# Patient Record
Sex: Female | Born: 1990 | Race: Black or African American | Hispanic: No | Marital: Single | Smoking: Never smoker
Health system: Southern US, Community
[De-identification: ages and names within clinical notes are randomized; demographics above are authoritative.]

## PROBLEM LIST (undated history)

## (undated) DIAGNOSIS — D649 Anemia, unspecified: Secondary | ICD-10-CM

## (undated) DIAGNOSIS — T7840XA Allergy, unspecified, initial encounter: Secondary | ICD-10-CM

## (undated) DIAGNOSIS — L309 Dermatitis, unspecified: Secondary | ICD-10-CM

## (undated) DIAGNOSIS — R160 Hepatomegaly, not elsewhere classified: Secondary | ICD-10-CM

## (undated) DIAGNOSIS — I10 Essential (primary) hypertension: Secondary | ICD-10-CM

## (undated) DIAGNOSIS — K661 Hemoperitoneum: Secondary | ICD-10-CM

## (undated) HISTORY — DX: Allergy, unspecified, initial encounter: T78.40XA

## (undated) HISTORY — PX: EYE SURGERY: SHX253

---

## 2012-04-12 ENCOUNTER — Emergency Department (HOSPITAL_COMMUNITY)
Admission: EM | Admit: 2012-04-12 | Discharge: 2012-04-12 | Disposition: A | Discharge status: Home / Self Care | Payer: BC Managed Care – PPO | Source: Home / Self Care | Attending: Emergency Medicine | Admitting: Emergency Medicine

## 2012-04-12 ENCOUNTER — Encounter (HOSPITAL_COMMUNITY): Payer: Self-pay | Admitting: Emergency Medicine

## 2012-04-12 DIAGNOSIS — L309 Dermatitis, unspecified: Secondary | ICD-10-CM

## 2012-04-12 DIAGNOSIS — L259 Unspecified contact dermatitis, unspecified cause: Secondary | ICD-10-CM

## 2012-04-12 HISTORY — DX: Dermatitis, unspecified: L30.9

## 2012-04-12 MED ORDER — HYDROXYZINE HCL 25 MG PO TABS: 25.0000 mg | ORAL_TABLET | Freq: Four times a day (QID) | ORAL | Status: AC

## 2012-04-12 MED ORDER — PREDNISONE 5 MG PO KIT: 1.0000 | PACK | Freq: Every day | ORAL | Status: DC

## 2012-04-12 MED ORDER — TRIAMCINOLONE ACETONIDE 0.1 % EX CREA: TOPICAL_CREAM | Freq: Three times a day (TID) | CUTANEOUS | Status: AC

## 2012-04-12 NOTE — Discharge Instructions (Signed)

## 2012-04-12 NOTE — ED Provider Notes (Signed)
Chief Complaint  Patient presents with  . 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 She has used triamcinolone cream with good results for this in the past. She's had a flareup of eczema for the past month. This began on her right leg and spread to her left arm, back, chest, and abdomen. Its itchy at times but sometimes not. She denies any other obvious exposures.  Review of Systems:  Other than noted above, the patient denies any of the following symptoms: Systemic:  No fever, chills, sweats, weight loss, or fatigue. ENT:  No nasal congestion, rhinorrhea, sore throat, swelling of lips, tongue or throat. Resp:  No cough, wheezing, or shortness of breath. Skin:  No rash, itching, nodules, or suspicious lesions.  PMFSH:  Past medical history, family history, social history, meds, and allergies were reviewed.  Physical Exam:   Vital signs:  BP 136/82  Pulse 90  Temp(Src) 97.7 F (36.5 C) (Oral)  Resp 14  SpO2 97% Gen:  Alert, oriented, in no distress. Skin:  She has a typical eczema rash on her neck, chest, both antecubital fossa as, arms, lower back, abdomen, and right calf. This is characterized by excoriations, hyperpigmentation, lichenification, and exaggerated skin fold lines. Her skin is otherwise clear.  Assessment:  The encounter diagnosis was Eczema.  Plan:   1.  The following meds were prescribed:   New Prescriptions   HYDROXYZINE (ATARAX/VISTARIL) 25 MG TABLET    Take 1 tablet (25 mg total) by mouth every 6 (six) hours.   PREDNISONE 5 MG KIT    Take 1 kit (5 mg total) by mouth daily after breakfast. Prednisone 5 mg 6 day dosepack.  Take as directed.   TRIAMCINOLONE CREAM (KENALOG) 0.1 %    Apply topically 3 (three) times daily.   2.  The patient was instructed in symptomatic care and handouts were given. 3.  The patient was told to return if becoming worse in any way, if no better in 3 or 4  days, and given some red flag symptoms that would indicate earlier return.     Reuben Likes, MD 04/12/12 [phone]

## 2012-04-12 NOTE — ED Notes (Signed)
Pt. Stated, I've had this rash and I have eczema but it just flares up periodically

## 2012-09-09 ENCOUNTER — Encounter (HOSPITAL_COMMUNITY): Payer: Self-pay | Admitting: Emergency Medicine

## 2012-09-09 ENCOUNTER — Emergency Department (HOSPITAL_COMMUNITY)
Admission: EM | Admit: 2012-09-09 | Discharge: 2012-09-09 | Disposition: A | Discharge status: Home / Self Care | Payer: BC Managed Care – PPO | Source: Home / Self Care | Attending: Emergency Medicine | Admitting: Emergency Medicine

## 2012-09-09 DIAGNOSIS — J020 Streptococcal pharyngitis: Secondary | ICD-10-CM

## 2012-09-09 MED ORDER — PENICILLIN G BENZATHINE 1200000 UNIT/2ML IM SUSP
1.2000 10*6.[IU] | Freq: Once | INTRAMUSCULAR | Status: AC
  Administered 2012-09-09: 1.2 10*6.[IU] via INTRAMUSCULAR

## 2012-09-09 MED ORDER — PENICILLIN V POTASSIUM 500 MG PO TABS: 500.0000 mg | ORAL_TABLET | Freq: Four times a day (QID) | ORAL | Status: DC

## 2012-09-09 MED ORDER — PENICILLIN G BENZATHINE 1200000 UNIT/2ML IM SUSP
INTRAMUSCULAR | Status: AC
  Filled 2012-09-09: qty 2

## 2012-09-09 NOTE — ED Provider Notes (Signed)
Chief Complaint  Patient presents with  . Sore Throat    History of Present Illness:   The patient is a 21 year old female with a five-day history of sore throat, pain on swallowing, nasal congestion, swollen, tender glands in her neck, and a dry cough. She denies any headache, GI symptoms, or previous history of strep. No known exposure to strep or to mono.  Review of Systems:  Other than as noted above, the patient denies any of the following symptoms. Systemic:  No fever, chills, sweats, fatigue, myalgias, headache, or anorexia. Eye:  No redness, pain or drainage. ENT:  No earache, ear congestion, nasal congestion, sneezing, rhinorrhea, sinus pressure, sinus pain, or post nasal drip. Lungs:  No cough, sputum production, wheezing, shortness of breath, or chest pain. GI:  No abdominal pain, nausea, vomiting, or diarrhea. Skin:  No rash or itching.  PMFSH:  Past medical history, family history, social history, meds, allergies, and nurse's notes were reviewed.  There is no known exposure to strep or mono.  No prior history of step or mono.  The patient denies use of tobacco.  Physical Exam:   Vital signs:  BP 139/96  Pulse 89  Temp 98.3 F (36.8 C) (Oral)  Resp 18  SpO2 100%  LMP 08/28/2012 General:  Alert, in no distress. Eye:  No conjunctival injection or drainage. Lids were normal. ENT:  TMs and canals were normal, without erythema or inflammation.  Nasal mucosa was clear and uncongested, without drainage.  Mucous membranes were moist.  Exam of pharynx was difficult because of poor patient cooperation, no definite inflammation, swelling, or exudate seen.  There were no oral ulcerations or lesions. Neck:  Supple, no adenopathy, tenderness or mass. Lungs:  No respiratory distress.  Lungs were clear to auscultation, without wheezes, rales or rhonchi.  Breath sounds were clear and equal bilaterally.  Heart:  Regular rhythm, without gallops, murmers or rubs. Skin:  Clear, warm, and dry,  without rash or lesions.  Labs:   Results for orders placed during the hospital encounter of 09/09/12  POCT RAPID STREP A ([facility] ONLY)      Component Value Range   Streptococcus, Group A Screen (Direct) POSITIVE (*) NEGATIVE   Course in Urgent Care Center:   She was given LA Bicillin 1.2 million units IM and tolerated this well without any immediate side effects.  Assessment:  The encounter diagnosis was Strep throat.  Plan:   1.  The following meds were prescribed:   New Prescriptions   No medications on file   2.  The patient was instructed in symptomatic care including hot saline gargles, throat lozenges, infectious precautions, and need to trade out toothbrush. Handouts were given. 3.  The patient was told to return if becoming worse in any way, if no better in 3 or 4 days, and given some red flag symptoms that would indicate earlier return.    Reuben Likes, MD 09/09/12 [phone]

## 2012-09-09 NOTE — ED Notes (Signed)
Pt states she has had a sore throat for 5 days now that has not gotten better despite OTC meds and "home remedies". Pt c/o productive cough of green thick discharge, no other pain. Pt said she has been congested, denies ear and head pain.

## 2014-01-30 ENCOUNTER — Ambulatory Visit (INDEPENDENT_AMBULATORY_CARE_PROVIDER_SITE_OTHER): Payer: BC Managed Care – PPO | Admitting: Internal Medicine

## 2014-01-30 VITALS — BP 124/76 | HR 93 | Temp 98.0°F | Resp 17 | Ht 63.0 in | Wt 218.0 lb

## 2014-01-30 DIAGNOSIS — R05 Cough: Secondary | ICD-10-CM

## 2014-01-30 DIAGNOSIS — R059 Cough, unspecified: Secondary | ICD-10-CM

## 2014-01-30 DIAGNOSIS — J069 Acute upper respiratory infection, unspecified: Secondary | ICD-10-CM

## 2014-01-30 MED ORDER — HYDROCODONE-HOMATROPINE 5-1.5 MG/5ML PO SYRP: 5.0000 mL | ORAL_SOLUTION | Freq: Four times a day (QID) | ORAL | Status: DC | PRN

## 2014-01-30 NOTE — Progress Notes (Signed)
   Subjective:    Patient ID: Latoya Kramer, female    DOB: 1991-05-21, 23 y.o.   MRN: [id number] This chart was scribed for Latoya Lin, MD by Rolanda Lundborg, ED Scribe. This patient was seen in room 10 and the patient's care was started at 6:44 PM.  Chief Complaint  Patient presents with  . Chest Pain  . Cough  . Sinusitis  . URI    HPI HPI Comments: Latoya Kramer is a 23 y.o. female who presents to the [facility] complaining of constant cough since walking outside in the rain yesterday with associated nasal congestion and rhinorrhea. She reports coughing up mucous but no vomiting. She took NyQuil last night. She had hot tea with honey this morning.   Past Medical History  Diagnosis Date  . Eczema   . Allergy    No current outpatient prescriptions on file prior to visit.   No current facility-administered medications on file prior to visit.   Allergies  Allergen Reactions  . Sulfa Antibiotics     Review of Systems  Constitutional: Negative for fever and appetite change.  Gastrointestinal: Negative for vomiting.       Objective:   Physical Exam  Nursing note and vitals reviewed. Constitutional: She is oriented to person, place, and time. She appears well-developed and well-nourished. No distress.  HENT:  Head: Normocephalic and atraumatic.  Nose: Nose normal.  Mouth/Throat: Oropharynx is clear and moist. No oropharyngeal exudate.  Eyes: Conjunctivae and EOM are normal. Pupils are equal, round, and reactive to light.  Neck: Normal range of motion. Neck supple.  Cardiovascular: Normal rate and regular rhythm.   Pulmonary/Chest: Effort normal and breath sounds normal. No respiratory distress.  Musculoskeletal: Normal range of motion.  Neurological: She is alert and oriented to person, place, and time.  Skin: Skin is warm and dry.  Psychiatric: She has a normal mood and affect. Her behavior is normal.    Filed Vitals:   01/30/14 1819  BP: 124/76  Pulse: 93  Temp: 98 F (36.7 C)  TempSrc: Oral  Resp: 17  Height: 5\' 3"  (1.6 m)  Weight: 218 lb (98.884 kg)  SpO2: 100%         Assessment & Plan:  Acute upper respiratory infections of unspecified site  Cough  Meds ordered this encounter  Medications  . HYDROcodone-homatropine (HYCODAN) 5-1.5 MG/5ML syrup    Sig: Take 5 mLs by mouth every 6 (six) hours as needed for cough.    Dispense:  120 mL    Refill:  0   Continue OTC meds  I have completed the patient encounter in its entirety as documented by the scribe, with editing by me where necessary. Ahri Olson P. Laney Pastor, M.D.

## 2015-07-06 ENCOUNTER — Encounter (HOSPITAL_COMMUNITY): Payer: Self-pay | Admitting: Emergency Medicine

## 2015-07-06 ENCOUNTER — Emergency Department (HOSPITAL_COMMUNITY)
Admission: EM | Admit: 2015-07-06 | Discharge: 2015-07-06 | Disposition: A | Discharge status: Home / Self Care | Payer: BC Managed Care – PPO | Attending: Emergency Medicine | Admitting: Emergency Medicine

## 2015-07-06 DIAGNOSIS — S199XXA Unspecified injury of neck, initial encounter: Secondary | ICD-10-CM | POA: Insufficient documentation

## 2015-07-06 DIAGNOSIS — Y999 Unspecified external cause status: Secondary | ICD-10-CM | POA: Insufficient documentation

## 2015-07-06 DIAGNOSIS — S4990XA Unspecified injury of shoulder and upper arm, unspecified arm, initial encounter: Secondary | ICD-10-CM | POA: Insufficient documentation

## 2015-07-06 DIAGNOSIS — S3992XA Unspecified injury of lower back, initial encounter: Secondary | ICD-10-CM | POA: Diagnosis not present

## 2015-07-06 DIAGNOSIS — Z79899 Other long term (current) drug therapy: Secondary | ICD-10-CM | POA: Diagnosis not present

## 2015-07-06 DIAGNOSIS — Y9389 Activity, other specified: Secondary | ICD-10-CM | POA: Diagnosis not present

## 2015-07-06 DIAGNOSIS — Y92410 Unspecified street and highway as the place of occurrence of the external cause: Secondary | ICD-10-CM | POA: Insufficient documentation

## 2015-07-06 DIAGNOSIS — Z872 Personal history of diseases of the skin and subcutaneous tissue: Secondary | ICD-10-CM | POA: Insufficient documentation

## 2015-07-06 LAB — I-STAT BETA HCG BLOOD, ED (MC, WL, AP ONLY): I-stat hCG, quantitative: <5 m[IU]/mL (ref <5)

## 2015-07-06 MED ORDER — KETOROLAC TROMETHAMINE 60 MG/2ML IM SOLN
60.0000 mg | Freq: Once | INTRAMUSCULAR | Status: AC
  Administered 2015-07-06: 60 mg via INTRAMUSCULAR
  Filled 2015-07-06: qty 2

## 2015-07-06 MED ORDER — IBUPROFEN 800 MG PO TABS: 800.0000 mg | ORAL_TABLET | Freq: Three times a day (TID) | ORAL | Status: DC | PRN

## 2015-07-06 MED ORDER — HYDROCODONE-ACETAMINOPHEN 5-325 MG PO TABS
1.0000 | ORAL_TABLET | Freq: Once | ORAL | Status: DC
  Filled 2015-07-06: qty 1

## 2015-07-06 NOTE — ED Notes (Signed)
MD at bedside. 
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
## 2018-04-23 ENCOUNTER — Encounter (HOSPITAL_COMMUNITY): Payer: Self-pay

## 2018-04-23 ENCOUNTER — Other Ambulatory Visit: Payer: Self-pay

## 2018-04-23 DIAGNOSIS — R739 Hyperglycemia, unspecified: Secondary | ICD-10-CM | POA: Diagnosis present

## 2018-04-23 DIAGNOSIS — Z79899 Other long term (current) drug therapy: Secondary | ICD-10-CM

## 2018-04-23 DIAGNOSIS — R6510 Systemic inflammatory response syndrome (SIRS) of non-infectious origin without acute organ dysfunction: Secondary | ICD-10-CM | POA: Diagnosis present

## 2018-04-23 DIAGNOSIS — Z6837 Body mass index (BMI) 37.0-37.9, adult: Secondary | ICD-10-CM

## 2018-04-23 DIAGNOSIS — I959 Hypotension, unspecified: Secondary | ICD-10-CM | POA: Diagnosis present

## 2018-04-23 DIAGNOSIS — D134 Benign neoplasm of liver: Secondary | ICD-10-CM | POA: Diagnosis not present

## 2018-04-23 DIAGNOSIS — I10 Essential (primary) hypertension: Secondary | ICD-10-CM | POA: Diagnosis present

## 2018-04-23 DIAGNOSIS — Z531 Procedure and treatment not carried out because of patient's decision for reasons of belief and group pressure: Secondary | ICD-10-CM | POA: Diagnosis present

## 2018-04-23 DIAGNOSIS — J189 Pneumonia, unspecified organism: Secondary | ICD-10-CM | POA: Diagnosis not present

## 2018-04-23 DIAGNOSIS — D62 Acute posthemorrhagic anemia: Secondary | ICD-10-CM | POA: Diagnosis present

## 2018-04-23 DIAGNOSIS — R51 Headache: Secondary | ICD-10-CM | POA: Diagnosis not present

## 2018-04-23 DIAGNOSIS — K661 Hemoperitoneum: Secondary | ICD-10-CM | POA: Diagnosis present

## 2018-04-23 DIAGNOSIS — R188 Other ascites: Secondary | ICD-10-CM | POA: Diagnosis present

## 2018-04-23 DIAGNOSIS — R000 Tachycardia, unspecified: Secondary | ICD-10-CM | POA: Diagnosis present
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
## 2018-04-24 ENCOUNTER — Emergency Department (HOSPITAL_COMMUNITY): Payer: BC Managed Care – PPO

## 2018-04-24 ENCOUNTER — Inpatient Hospital Stay (HOSPITAL_COMMUNITY)
Admission: EM | Admit: 2018-04-24 | Discharge: 2018-05-01 | DRG: 987 | Disposition: A | Discharge status: Other Acute Inpatient Hospital | Payer: BC Managed Care – PPO | Attending: Internal Medicine | Admitting: Internal Medicine

## 2018-04-24 ENCOUNTER — Encounter (HOSPITAL_COMMUNITY): Payer: Self-pay | Admitting: Radiology

## 2018-04-24 ENCOUNTER — Inpatient Hospital Stay (HOSPITAL_COMMUNITY): Payer: BC Managed Care – PPO

## 2018-04-24 DIAGNOSIS — I159 Secondary hypertension, unspecified: Secondary | ICD-10-CM

## 2018-04-24 DIAGNOSIS — R000 Tachycardia, unspecified: Secondary | ICD-10-CM | POA: Diagnosis present

## 2018-04-24 DIAGNOSIS — D72829 Elevated white blood cell count, unspecified: Secondary | ICD-10-CM | POA: Diagnosis present

## 2018-04-24 DIAGNOSIS — R1031 Right lower quadrant pain: Secondary | ICD-10-CM | POA: Diagnosis not present

## 2018-04-24 DIAGNOSIS — R509 Fever, unspecified: Secondary | ICD-10-CM | POA: Diagnosis not present

## 2018-04-24 DIAGNOSIS — J189 Pneumonia, unspecified organism: Secondary | ICD-10-CM | POA: Diagnosis present

## 2018-04-24 DIAGNOSIS — R109 Unspecified abdominal pain: Secondary | ICD-10-CM

## 2018-04-24 DIAGNOSIS — R739 Hyperglycemia, unspecified: Secondary | ICD-10-CM | POA: Diagnosis present

## 2018-04-24 DIAGNOSIS — Z79899 Other long term (current) drug therapy: Secondary | ICD-10-CM | POA: Diagnosis not present

## 2018-04-24 DIAGNOSIS — R6510 Systemic inflammatory response syndrome (SIRS) of non-infectious origin without acute organ dysfunction: Secondary | ICD-10-CM | POA: Diagnosis present

## 2018-04-24 DIAGNOSIS — I10 Essential (primary) hypertension: Secondary | ICD-10-CM | POA: Diagnosis present

## 2018-04-24 DIAGNOSIS — E669 Obesity, unspecified: Secondary | ICD-10-CM | POA: Diagnosis present

## 2018-04-24 DIAGNOSIS — K661 Hemoperitoneum: Secondary | ICD-10-CM | POA: Diagnosis present

## 2018-04-24 DIAGNOSIS — D649 Anemia, unspecified: Secondary | ICD-10-CM | POA: Diagnosis not present

## 2018-04-24 DIAGNOSIS — D134 Benign neoplasm of liver: Secondary | ICD-10-CM | POA: Diagnosis present

## 2018-04-24 DIAGNOSIS — D62 Acute posthemorrhagic anemia: Secondary | ICD-10-CM | POA: Diagnosis present

## 2018-04-24 DIAGNOSIS — Z6837 Body mass index (BMI) 37.0-37.9, adult: Secondary | ICD-10-CM | POA: Diagnosis not present

## 2018-04-24 DIAGNOSIS — I959 Hypotension, unspecified: Secondary | ICD-10-CM | POA: Diagnosis present

## 2018-04-24 DIAGNOSIS — R160 Hepatomegaly, not elsewhere classified: Secondary | ICD-10-CM

## 2018-04-24 DIAGNOSIS — R188 Other ascites: Secondary | ICD-10-CM | POA: Diagnosis present

## 2018-04-24 DIAGNOSIS — R51 Headache: Secondary | ICD-10-CM | POA: Diagnosis not present

## 2018-04-24 DIAGNOSIS — Z531 Procedure and treatment not carried out because of patient's decision for reasons of belief and group pressure: Secondary | ICD-10-CM | POA: Diagnosis present

## 2018-04-24 HISTORY — DX: Hepatomegaly, not elsewhere classified: R16.0

## 2018-04-24 HISTORY — DX: Hemoperitoneum: K66.1

## 2018-04-24 HISTORY — DX: Anemia, unspecified: D64.9

## 2018-04-24 LAB — CBC WITH DIFFERENTIAL/PLATELET
Basophils Absolute: 0 10*3/uL (ref 0.0–0.1)
Basophils Relative: 0 %
EOS ABS: 0 10*3/uL (ref 0.0–0.7)
EOS PCT: 0 %
HCT: 29.3 % — ABNORMAL LOW (ref 36.0–46.0)
Hemoglobin: 9.4 g/dL — ABNORMAL LOW (ref 12.0–15.0)
LYMPHS ABS: 1.8 10*3/uL (ref 0.7–4.0)
LYMPHS PCT: 16 %
MCH: 29.1 pg (ref 26.0–34.0)
MCHC: 32.1 g/dL (ref 30.0–36.0)
MCV: 90.7 fL (ref 78.0–100.0)
MONO ABS: 0.5 10*3/uL (ref 0.1–1.0)
MONOS PCT: 5 %
Neutro Abs: 9 10*3/uL — ABNORMAL HIGH (ref 1.7–7.7)
Neutrophils Relative %: 79 %
PLATELETS: 219 10*3/uL (ref 150–400)
RBC: 3.23 MIL/uL — ABNORMAL LOW (ref 3.87–5.11)
RDW: 13.8 % (ref 11.5–15.5)
WBC: 11.3 10*3/uL — ABNORMAL HIGH (ref 4.0–10.5)

## 2018-04-24 LAB — CBC
HCT: 33.5 % — ABNORMAL LOW (ref 36.0–46.0)
HEMATOCRIT: 27.6 % — AB (ref 36.0–46.0)
HEMOGLOBIN: 10.6 g/dL — AB (ref 12.0–15.0)
HEMOGLOBIN: 8.8 g/dL — AB (ref 12.0–15.0)
MCH: 29 pg (ref 26.0–34.0)
MCH: 29.1 pg (ref 26.0–34.0)
MCHC: 31.6 g/dL (ref 30.0–36.0)
MCHC: 31.9 g/dL (ref 30.0–36.0)
MCV: 91.5 fL (ref 78.0–100.0)
MCV: 91.4 fL (ref 78.0–100.0)
Platelets: 272 10*3/uL (ref 150–400)
Platelets: 198 10*3/uL (ref 150–400)
RBC: 3.66 MIL/uL — AB (ref 3.87–5.11)
RBC: 3.02 MIL/uL — AB (ref 3.87–5.11)
RDW: 13.8 % (ref 11.5–15.5)
RDW: 13.7 % (ref 11.5–15.5)
WBC: 16.8 10*3/uL — ABNORMAL HIGH (ref 4.0–10.5)
WBC: 10.3 10*3/uL (ref 4.0–10.5)

## 2018-04-24 LAB — URINALYSIS, ROUTINE W REFLEX MICROSCOPIC
Bilirubin Urine: NEGATIVE
GLUCOSE, UA: NEGATIVE mg/dL
Ketones, ur: 5 mg/dL — AB
NITRITE: NEGATIVE
PH: 5 (ref 5.0–8.0)
PROTEIN: 100 mg/dL — AB
SPECIFIC GRAVITY, URINE: 1.027 (ref 1.005–1.030)

## 2018-04-24 LAB — PROTIME-INR
INR: 1.07
Prothrombin Time: 13.8 seconds (ref 11.4–15.2)

## 2018-04-24 LAB — COMPREHENSIVE METABOLIC PANEL
ALBUMIN: 4.4 g/dL (ref 3.5–5.0)
ALT: 65 U/L — ABNORMAL HIGH (ref 14–54)
ANION GAP: 11 (ref 5–15)
AST: 67 U/L — ABNORMAL HIGH (ref 15–41)
Alkaline Phosphatase: 56 U/L (ref 38–126)
BUN: 15 mg/dL (ref 6–20)
CALCIUM: 9.5 mg/dL (ref 8.9–10.3)
CO2: 24 mmol/L (ref 22–32)
Chloride: 107 mmol/L (ref 101–111)
Creatinine, Ser: 0.81 mg/dL (ref 0.44–1.00)
GFR calc non Af Amer: >60 mL/min (ref >60)
GLUCOSE: 154 mg/dL — AB (ref 65–99)
Potassium: 4.1 mmol/L (ref 3.5–5.1)
SODIUM: 142 mmol/L (ref 135–145)
Total Bilirubin: 0.5 mg/dL (ref 0.3–1.2)
Total Protein: 8 g/dL (ref 6.5–8.1)

## 2018-04-24 LAB — APTT: aPTT: 24 seconds (ref 24–36)

## 2018-04-24 LAB — HEMOGLOBIN A1C
Hgb A1c MFr Bld: 5.8 % — ABNORMAL HIGH (ref 4.8–5.6)
Mean Plasma Glucose: 119.76 mg/dL

## 2018-04-24 LAB — CBG MONITORING, ED: GLUCOSE-CAPILLARY: 113 mg/dL — AB (ref 65–99)

## 2018-04-24 LAB — GLUCOSE, CAPILLARY
Glucose-Capillary: 84 mg/dL (ref 65–99)
Glucose-Capillary: 106 mg/dL — ABNORMAL HIGH (ref 65–99)

## 2018-04-24 LAB — I-STAT BETA HCG BLOOD, ED (MC, WL, AP ONLY): I-stat hCG, quantitative: <5 m[IU]/mL (ref <5)

## 2018-04-24 LAB — LACTIC ACID, PLASMA: Lactic Acid, Venous: 1.7 mmol/L (ref 0.5–1.9)

## 2018-04-24 LAB — LIPASE, BLOOD: LIPASE: 35 U/L (ref 11–51)

## 2018-04-24 MED ORDER — LORAZEPAM 2 MG/ML IJ SOLN: 1.0000 mg | Freq: Once | INTRAMUSCULAR | Status: DC

## 2018-04-24 MED ORDER — ONDANSETRON HCL 4 MG/2ML IJ SOLN: 4.0000 mg | Freq: Four times a day (QID) | INTRAMUSCULAR | Status: DC | PRN

## 2018-04-24 MED ORDER — INSULIN ASPART 100 UNIT/ML ~~LOC~~ SOLN: 0.0000 [IU] | Freq: Every day | SUBCUTANEOUS | Status: DC

## 2018-04-24 MED ORDER — FAMOTIDINE IN NACL 20-0.9 MG/50ML-% IV SOLN
20.0000 mg | Freq: Once | INTRAVENOUS | Status: AC
  Administered 2018-04-24: 20 mg via INTRAVENOUS
  Filled 2018-04-24: qty 50

## 2018-04-24 MED ORDER — HYDROMORPHONE HCL 1 MG/ML IJ SOLN
1.0000 mg | INTRAMUSCULAR | Status: DC | PRN
  Administered 2018-04-24 – 2018-04-29 (×21): 1 mg via INTRAVENOUS
  Filled 2018-04-24 (×21): qty 1

## 2018-04-24 MED ORDER — ONDANSETRON HCL 4 MG/2ML IJ SOLN
4.0000 mg | Freq: Once | INTRAMUSCULAR | Status: AC
  Administered 2018-04-24: 4 mg via INTRAVENOUS
  Filled 2018-04-24: qty 2

## 2018-04-24 MED ORDER — IOPAMIDOL (ISOVUE-300) INJECTION 61%
100.0000 mL | Freq: Once | INTRAVENOUS | Status: AC | PRN
  Administered 2018-04-24: 100 mL via INTRAVENOUS

## 2018-04-24 MED ORDER — IOPAMIDOL (ISOVUE-300) INJECTION 61%
INTRAVENOUS | Status: AC
  Filled 2018-04-24: qty 100

## 2018-04-24 MED ORDER — SODIUM CHLORIDE 0.9 % IV BOLUS
1000.0000 mL | Freq: Once | INTRAVENOUS | Status: AC
  Administered 2018-04-24: 1000 mL via INTRAVENOUS

## 2018-04-24 MED ORDER — LORAZEPAM 2 MG/ML IJ SOLN
1.0000 mg | Freq: Once | INTRAMUSCULAR | Status: AC
  Administered 2018-04-24: 1 mg via INTRAVENOUS
  Filled 2018-04-24: qty 1

## 2018-04-24 MED ORDER — GADOBENATE DIMEGLUMINE 529 MG/ML IV SOLN: 20.0000 mL | Freq: Once | INTRAVENOUS | Status: DC | PRN

## 2018-04-24 MED ORDER — SODIUM CHLORIDE 0.9 % IV SOLN
INTRAVENOUS | Status: DC
  Administered 2018-04-24 (×2): via INTRAVENOUS

## 2018-04-24 MED ORDER — INSULIN ASPART 100 UNIT/ML ~~LOC~~ SOLN
0.0000 [IU] | Freq: Three times a day (TID) | SUBCUTANEOUS | Status: DC
  Administered 2018-04-25 (×2): 1 [IU] via SUBCUTANEOUS

## 2018-04-24 MED ORDER — GADOBENATE DIMEGLUMINE 529 MG/ML IV SOLN
20.0000 mL | Freq: Once | INTRAVENOUS | Status: AC | PRN
  Administered 2018-04-24: 20 mL via INTRAVENOUS

## 2018-04-24 MED ORDER — HYDROMORPHONE HCL 1 MG/ML IJ SOLN
1.0000 mg | Freq: Once | INTRAMUSCULAR | Status: AC
  Administered 2018-04-24: 1 mg via INTRAVENOUS
  Filled 2018-04-24: qty 1

## 2018-04-24 MED ORDER — DICYCLOMINE HCL 10 MG/ML IM SOLN
20.0000 mg | Freq: Once | INTRAMUSCULAR | Status: AC
  Administered 2018-04-24: 20 mg via INTRAMUSCULAR
  Filled 2018-04-24: qty 2

## 2018-04-24 MED ORDER — ONDANSETRON HCL 4 MG PO TABS: 4.0000 mg | ORAL_TABLET | Freq: Four times a day (QID) | ORAL | Status: DC | PRN

## 2018-04-24 MED ORDER — GI COCKTAIL ~~LOC~~
30.0000 mL | Freq: Once | ORAL | Status: AC
  Administered 2018-04-24: 30 mL via ORAL
  Filled 2018-04-24: qty 30

## 2018-04-24 NOTE — ED Notes (Signed)
Urine culture not sent; she only had a small amount of output.

## 2018-04-24 NOTE — ED Provider Notes (Signed)
Pt with abdominal pain, nausea, vomiting, RUQ abd pain. CT pending   8:10 AM Spoke with radiologist, patient with large hypodense mass in the posterior right liver lobe, with moderate volume of posterior right acute hemoperitoneum.  Initial hemoglobin is 10.4.  Patient is Jehov[employer]'s Witness, unable to receive blood products.  She is tachycardic.  Normal blood pressure.  Will call in general surgery. Pt denies any trauma to the abdomen   I spoke with Dr. Harlow Asa, with general surgery who reviewed patient's scan.  He asked to admit to medicine for possible EPO treatment and further monitoring and observation.  He does not think this is a surgical issue at this time, however he will consult on patient.  I spoke with medicine they will admit her.   Vitals:   04/24/18 0232 04/24/18 0502 04/24/18 0700 04/24/18 0907  BP: 123/88 124/81 (!) 123/102 (!) 133/96  Pulse: (!) 112 (!) 101 (!) 108 (!) 118  Resp: 16 18 18 18   Temp:      TempSrc:      SpO2: 100% 98% 100% 100%      Jeannett Senior, PA-C 04/24/18 0258    Margette Fast, MD 04/24/18 [phone]

## 2018-04-24 NOTE — H&P (Signed)
History and Physical   Chief Complaint:  Abdominal pain  HPI: Patient is a 27 y.o female that presents to the ED with complaints of abdominal pain. States that the pain started last night after eating and drinking alcohol at a wedding. She came home and began experiencing pain in her upper abdomen. States that the sharp pain does not radiate anywhere else and is a 10/10 in intensity. States the pain is worse with movement and laying on right side; pain is improved with laying on left side. She did not attempt any therapies prior to coming to the ED. Also endorses nausea, belching, and one episode of emesis. Denies seeing blood in the emesis. Denies flatus. Denies any trauma to the abdomen. No history of any abdominal surgeries.   Allergies:   Allergies  Allergen Reactions  . Sulfa Antibiotics Other (See Comments)    unknown      Past Medical History:  Diagnosis Date  . Allergy   . Anemia   . Eczema   . Hemoperitoneum, nontraumatic   . Liver mass     Past Surgical History:  Procedure Laterality Date  . EYE SURGERY      Prior to Admission medications   Medication Sig Start Date End Date Taking? Authorizing Provider  LO LOESTRIN FE 1 MG-10 MCG / 10 MCG tablet Take 1 tablet by mouth daily. 04/08/18  Yes [provider]  triamterene-hydrochlorothiazide (DYAZIDE) 37.5-25 MG capsule Take 1 capsule by mouth daily. 04/08/18  Yes [provider]    Social History: Denies tobacco and drug use. Admits to occasional alcohol consumption (2 drinks per month).   History reviewed. No pertinent family history.  Review of Systems:  Review of Systems  Constitutional: Negative for chills, fever and weight loss.  Respiratory: Negative for hemoptysis and shortness of breath.   Cardiovascular: Negative for chest pain and palpitations.  Gastrointestinal: Positive for abdominal pain, nausea and vomiting.  Genitourinary: Negative for dysuria, frequency and urgency.     Physical Exam: Blood pressure (!) 133/96, pulse (!) 118, temperature (!) 97.5 F (36.4 C), temperature source Oral, resp. rate 18, last menstrual period 04/24/2018, SpO2 100 %.   Physical Exam  Constitutional: She is oriented to person, place, and time and well-developed, well-nourished, and in no distress.  HENT:  Head: Normocephalic and atraumatic.  Cardiovascular: Normal rate and regular rhythm.  Pulmonary/Chest: Breath sounds normal.  Abdominal: Soft. She exhibits distension. There is tenderness. There is guarding. There is no rebound.  Tenderness diffusely in the upper abdomen with some right sided prominence.   Neurological: She is alert and oriented to person, place, and time.    Labs on Admission:  Results for orders placed or performed during the hospital encounter of 04/24/18 (from the past 48 hour(s))  Lipase, blood     Status: None   Collection Time: 04/24/18  2:31 AM  Result Value Ref Range   Lipase 35 11 - 51 U/L    Comment: Performed at [facility], [place] [address], [place], [place] [address]  Comprehensive metabolic panel     Status: Abnormal   Collection Time: 04/24/18  2:31 AM  Result Value Ref Range   Sodium 142 135 - 145 mmol/L   Potassium 4.1 3.5 - 5.1 mmol/L   Chloride 107 101 - 111 mmol/L   CO2 24 22 - 32 mmol/L   Glucose, Bld 154 (H) 65 - 99 mg/dL   BUN 15 6 - 20 mg/dL   Creatinine, Ser 0.81  0.44 - 1.00 mg/dL   Calcium 9.5 8.9 - 10.3 mg/dL   Total Protein 8.0 6.5 - 8.1 g/dL   Albumin 4.4 3.5 - 5.0 g/dL   AST 67 (H) 15 - 41 U/L   ALT 65 (H) 14 - 54 U/L   Alkaline Phosphatase 56 38 - 126 U/L   Total Bilirubin 0.5 0.3 - 1.2 mg/dL   GFR calc non Af Amer >60 >60 mL/min   GFR calc Af Amer >60 >60 mL/min    Comment: (NOTE) The eGFR has been calculated using the CKD EPI equation. This calculation has not been validated in all clinical situations. eGFR's persistently <60 mL/min signify possible Chronic Kidney Disease.    Anion gap  11 5 - 15    Comment: Performed at [facility], [place] [address] [place], [place] [address]  CBC     Status: Abnormal   Collection Time: 04/24/18  2:31 AM  Result Value Ref Range   WBC 16.8 (H) 4.0 - 10.5 K/uL   RBC 3.66 (L) 3.87 - 5.11 MIL/uL   Hemoglobin 10.6 (L) 12.0 - 15.0 g/dL   HCT 33.5 (L) 36.0 - 46.0 %   MCV 91.5 78.0 - 100.0 fL   MCH 29.0 26.0 - 34.0 pg   MCHC 31.6 30.0 - 36.0 g/dL   RDW 13.8 11.5 - 15.5 %   Platelets 272 150 - 400 K/uL    Comment: Performed at [facility], [place] [address] [place], [place] [address]  Urinalysis, Routine w reflex microscopic     Status: Abnormal   Collection Time: 04/24/18  2:31 AM  Result Value Ref Range   Color, Urine YELLOW YELLOW   APPearance CLOUDY (A) CLEAR   Specific Gravity, Urine 1.027 1.005 - 1.030   pH 5.0 5.0 - 8.0   Glucose, UA NEGATIVE NEGATIVE mg/dL   Hgb urine dipstick LARGE (A) NEGATIVE   Bilirubin Urine NEGATIVE NEGATIVE   Ketones, ur 5 (A) NEGATIVE mg/dL   Protein, ur 100 (A) NEGATIVE mg/dL   Nitrite NEGATIVE NEGATIVE   Leukocytes, UA TRACE (A) NEGATIVE   RBC / HPF 6-10 0 - 5 RBC/hpf   WBC, UA 6-10 0 - 5 WBC/hpf   Bacteria, UA FEW (A) NONE SEEN   Squamous Epithelial / LPF 11-20 0 - 5    Comment: Please note change in reference range.   Mucus PRESENT    Hyaline Casts, UA PRESENT     Comment: Performed at [facility], [place] [address] [place], [place] [address]  I-Stat beta hCG blood, ED     Status: None   Collection Time: 04/24/18  2:43 AM  Result Value Ref Range   I-stat hCG, quantitative <5.0 <5 mIU/mL   Comment 3            Comment:   GEST. AGE      CONC.  (mIU/mL)   <=1 WEEK        5 - 50     2 WEEKS       50 - 500     3 WEEKS       100 - 10,000     4 WEEKS     1,000 - 30,000        FEMALE AND NON-PREGNANT FEMALE:     LESS THAN 5 mIU/mL   Protime-INR     Status: None   Collection Time: 04/24/18  8:56 AM  Result Value Ref Range   Prothrombin Time  13.8 11.4 - 15.2 seconds   INR 1.07     Comment: Performed at [facility], [place] [address], [place], [place] [address]  CBC with Differential     Status: Abnormal   Collection Time: 04/24/18  8:56 AM  Result Value Ref Range   WBC 11.3 (H) 4.0 - 10.5 K/uL   RBC 3.23 (L) 3.87 - 5.11 MIL/uL   Hemoglobin 9.4 (L) 12.0 - 15.0 g/dL   HCT 29.3 (L) 36.0 - 46.0 %   MCV 90.7 78.0 - 100.0 fL   MCH 29.1 26.0 - 34.0 pg   MCHC 32.1 30.0 - 36.0 g/dL   RDW 13.8 11.5 - 15.5 %   Platelets 219 150 - 400 K/uL   Neutrophils Relative % 79 %   Neutro Abs 9.0 (H) 1.7 - 7.7 K/uL   Lymphocytes Relative 16 %   Lymphs Abs 1.8 0.7 - 4.0 K/uL   Monocytes Relative 5 %   Monocytes Absolute 0.5 0.1 - 1.0 K/uL   Eosinophils Relative 0 %   Eosinophils Absolute 0.0 0.0 - 0.7 K/uL   Basophils Relative 0 %   Basophils Absolute 0.0 0.0 - 0.1 K/uL    Comment: Performed at [facility], [place] [address] [place], [place] [address]  APTT     Status: None   Collection Time: 04/24/18  8:56 AM  Result Value Ref Range   aPTT 24 24 - 36 seconds    Comment: Performed at [facility], [place] [address] [place], [place] [address]    Radiological Exams on Admission: US Abdomen Complete  Result Date: 04/24/2018 CLINICAL DATA:  27 year old female with abdominal pain, nausea vomiting. EXAM: ABDOMEN ULTRASOUND COMPLETE COMPARISON:  Abdominal radiograph dated 04/24/2018 FINDINGS: Evaluation is limited due to patient's body habitus. Gallbladder: No gallstones or wall thickening visualized. No sonographic Murphy sign noted by sonographer. Probable trace free fluid adjacent to the gallbladder. Common bile duct: Diameter: 3 mm Liver: No focal lesion identified. Within normal limits in parenchymal echogenicity. Portal vein is patent on color Doppler imaging with normal direction of blood flow towards the liver. IVC: No abnormality visualized. Pancreas: Poorly visualized. Spleen: Size and  appearance within normal limits. Right Kidney: Length: 11.5 cm. The kidneys poorly visualized. No hydronephrosis. Left Kidney: Length: 10.6 cm.  The left kidney is poorly visualized. Abdominal aorta: No aneurysm visualized. Other findings: Small perihepatic free fluid. IMPRESSION: Small perihepatic free fluid. No other abnormality noted within the intention of the exam. Electronically Signed   By: Anner Crete M.D.   On: 04/24/2018 06:35   Ct Abdomen Pelvis W Contrast  Result Date: 04/24/2018 CLINICAL DATA:  Acute right upper quadrant abdominal pain. EXAM: CT ABDOMEN AND PELVIS WITH CONTRAST TECHNIQUE: Multidetector CT imaging of the abdomen and pelvis was performed using the standard protocol following bolus administration of intravenous contrast. CONTRAST:  132m ISOVUE-300 IOPAMIDOL (ISOVUE-300) INJECTION 61% COMPARISON:  Abdominal sonogram from earlier today. FINDINGS: Lower chest: No significant pulmonary nodules or acute consolidative airspace disease. Hepatobiliary: There is an 8.5 x 6.1 cm hypodense posterior right liver lobe mass (series 2/image 27) with irregular outer contour, which is directly contiguous with small to moderate volume posterior right perihepatic acute hyperdense hemoperitoneum. No additional liver masses. Normal gallbladder with no radiopaque cholelithiasis. No biliary ductal dilatation. Pancreas: Normal, with no mass or duct dilation. Spleen: Normal size. No mass. Adrenals/Urinary Tract: Normal adrenals. Normal kidneys with no hydronephrosis and no renal mass. Normal bladder. Stomach/Bowel: Normal non-distended stomach.  Normal caliber small bowel with no small bowel wall thickening. Normal appendix. Normal large bowel with no diverticulosis, large bowel wall thickening or pericolonic fat stranding. Vascular/Lymphatic: Normal caliber abdominal aorta. Patent portal, splenic, hepatic and renal veins. No pathologically enlarged lymph nodes in the abdomen or pelvis. Reproductive:  Grossly normal uterus.  No adnexal mass. Other: No pneumoperitoneum. Small to moderate volume hemoperitoneum in the right paracolic gutter and pelvic cul-de-sac. Musculoskeletal: No aggressive appearing focal osseous lesions. No evidence of a fracture. IMPRESSION: 1. Large 8.5 cm hypodense indeterminate posterior right liver lobe mass, which is directly contiguous with small to moderate volume posterior right perihepatic acute hemoperitoneum. Differential includes acute hemorrhagic rupture of an underlying liver mass (such as an hepatic adenoma in a young female) into the perihepatic space versus traumatic liver laceration. Short-term follow-up MRI abdomen without and with IV contrast recommended for further characterization. 2. Small to moderate volume hemoperitoneum in the right paracolic gutter and pelvic cul-de-sac. These results were called by telephone at the time of interpretation on 04/24/2018 at 8:05 am to [facility], who verbally acknowledged these results. Electronically Signed   By: Ilona Sorrel M.D.   On: 04/24/2018 08:07   Dg Abd 2 Views  Result Date: 04/24/2018 CLINICAL DATA:  27 y/o  F; mid abdominal pain, nausea, vomiting. EXAM: ABDOMEN - 2 VIEW COMPARISON:  None. FINDINGS: Nonobstructive bowel gas pattern. Large volume of stool in the colon may represent constipation. Bones are unremarkable. No pneumoperitoneum. IMPRESSION: Nonobstructive bowel gas pattern. Large volume of stool in the colon may represent constipation. Electronically Signed   By: Kristine Garbe M.D.   On: 04/24/2018 05:42    Assessment: Principal Problem:   Hemoperitoneum, nontraumatic Active Problems:   Anemia   Liver mass   Tachycardia   Hyperglycemia   Leukocytosis   Hypertension   Obesity  Plan: - Admit to Hospitalist service  - NPO - Obtain MRI of abdomen - Repeat CBC and CMP - Continue pain medication and anti-emetics PRN - Continue IVF    Caine Barfield PA-S 04/24/2018, 10:55 AM

## 2018-04-24 NOTE — ED Notes (Signed)
Patient refused to use bedpan. Patient instead on using on going to the bathroom while ordered to be bed rest. Nurse used wheelchair to get patient to the bathroom and back to bed.

## 2018-04-24 NOTE — ED Notes (Signed)
[facility] call for [facility] transport [facility]. 

## 2018-04-24 NOTE — ED Notes (Signed)
ED TO INPATIENT HANDOFF REPORT  Name/Age/Gender Latoya Kramer 27 y.o. female  Code Status    Code Status Orders  (From admission, onward)        Start     Ordered   04/24/18 0902  Full code  Continuous     04/24/18 0904    Code Status History    This patient has a current code status but no historical code status.      Home/SNF/Other Home  Chief Complaint ABD PAIN  Level of Care/Admitting Diagnosis ED Disposition    ED Disposition Condition Comment   Admit  Hospital Area: [facility] [[id number]]  Level of Care: Stepdown [14]  Diagnosis: Hemoperitoneum, nontraumatic [256389]  Admitting Physician: Cristal Ford [[id number]]  Attending Physician: Cristal Ford [phone]  Estimated length of stay: 3 - 4 days  Certification:: I certify this patient will need inpatient services for at least 2 midnights  PT Class (Do Not Modify): Inpatient [101]  PT Acc Code (Do Not Modify): Private [1]       Medical History Past Medical History:  Diagnosis Date  . Allergy   . Anemia   . Eczema   . Hemoperitoneum, nontraumatic   . Liver mass     Allergies Allergies  Allergen Reactions  . Sulfa Antibiotics Other (See Comments)    unknown    IV Location/Drains/Wounds Patient Lines/Drains/Airways Status   Active Line/Drains/Airways    Name:   Placement date:   Placement time:   Site:   Days:   Peripheral IV 04/24/18 Left Antecubital   04/24/18    0503    Antecubital   less than 1          Labs/Imaging Results for orders placed or performed during the hospital encounter of 04/24/18 (from the past 48 hour(s))  Lipase, blood     Status: None   Collection Time: 04/24/18  2:31 AM  Result Value Ref Range   Lipase 35 11 - 51 U/L    Comment: Performed at [facility], [place] [address] [place], [place] [address]  Comprehensive metabolic panel     Status: Abnormal   Collection Time: 04/24/18  2:31 AM  Result Value Ref Range    Sodium 142 135 - 145 mmol/L   Potassium 4.1 3.5 - 5.1 mmol/L   Chloride 107 101 - 111 mmol/L   CO2 24 22 - 32 mmol/L   Glucose, Bld 154 (H) 65 - 99 mg/dL   BUN 15 6 - 20 mg/dL   Creatinine, Ser 0.81 0.44 - 1.00 mg/dL   Calcium 9.5 8.9 - 10.3 mg/dL   Total Protein 8.0 6.5 - 8.1 g/dL   Albumin 4.4 3.5 - 5.0 g/dL   AST 67 (H) 15 - 41 U/L   ALT 65 (H) 14 - 54 U/L   Alkaline Phosphatase 56 38 - 126 U/L   Total Bilirubin 0.5 0.3 - 1.2 mg/dL   GFR calc non Af Amer >60 >60 mL/min   GFR calc Af Amer >60 >60 mL/min    Comment: (NOTE) The eGFR has been calculated using the CKD EPI equation. This calculation has not been validated in all clinical situations. eGFR's persistently <60 mL/min signify possible Chronic Kidney Disease.    Anion gap 11 5 - 15    Comment: Performed at [facility], [place] [address], [place], [place] [address]  CBC     Status: Abnormal   Collection Time: 04/24/18  2:31 AM  Result Value  Ref Range   WBC 16.8 (H) 4.0 - 10.5 K/uL   RBC 3.66 (L) 3.87 - 5.11 MIL/uL   Hemoglobin 10.6 (L) 12.0 - 15.0 g/dL   HCT 33.5 (L) 36.0 - 46.0 %   MCV 91.5 78.0 - 100.0 fL   MCH 29.0 26.0 - 34.0 pg   MCHC 31.6 30.0 - 36.0 g/dL   RDW 13.8 11.5 - 15.5 %   Platelets 272 150 - 400 K/uL    Comment: Performed at [facility], [place] [address] [place], [place] [address]  Urinalysis, Routine w reflex microscopic     Status: Abnormal   Collection Time: 04/24/18  2:31 AM  Result Value Ref Range   Color, Urine YELLOW YELLOW   APPearance CLOUDY (A) CLEAR   Specific Gravity, Urine 1.027 1.005 - 1.030   pH 5.0 5.0 - 8.0   Glucose, UA NEGATIVE NEGATIVE mg/dL   Hgb urine dipstick LARGE (A) NEGATIVE   Bilirubin Urine NEGATIVE NEGATIVE   Ketones, ur 5 (A) NEGATIVE mg/dL   Protein, ur 100 (A) NEGATIVE mg/dL   Nitrite NEGATIVE NEGATIVE   Leukocytes, UA TRACE (A) NEGATIVE   RBC / HPF 6-10 0 - 5 RBC/hpf   WBC, UA 6-10 0 - 5 WBC/hpf   Bacteria, UA FEW (A) NONE  SEEN   Squamous Epithelial / LPF 11-20 0 - 5    Comment: Please note change in reference range.   Mucus PRESENT    Hyaline Casts, UA PRESENT     Comment: Performed at [facility], [place] [address] [place], [place] [address]  I-Stat beta hCG blood, ED     Status: None   Collection Time: 04/24/18  2:43 AM  Result Value Ref Range   I-stat hCG, quantitative <5.0 <5 mIU/mL   Comment 3            Comment:   GEST. AGE      CONC.  (mIU/mL)   <=1 WEEK        5 - 50     2 WEEKS       50 - 500     3 WEEKS       100 - 10,000     4 WEEKS     1,000 - 30,000        FEMALE AND NON-PREGNANT FEMALE:     LESS THAN 5 mIU/mL   Protime-INR     Status: None   Collection Time: 04/24/18  8:56 AM  Result Value Ref Range   Prothrombin Time 13.8 11.4 - 15.2 seconds   INR 1.07     Comment: Performed at [facility], [place] [address] [place], [place] [address]  CBC with Differential     Status: Abnormal   Collection Time: 04/24/18  8:56 AM  Result Value Ref Range   WBC 11.3 (H) 4.0 - 10.5 K/uL   RBC 3.23 (L) 3.87 - 5.11 MIL/uL   Hemoglobin 9.4 (L) 12.0 - 15.0 g/dL   HCT 29.3 (L) 36.0 - 46.0 %   MCV 90.7 78.0 - 100.0 fL   MCH 29.1 26.0 - 34.0 pg   MCHC 32.1 30.0 - 36.0 g/dL   RDW 13.8 11.5 - 15.5 %   Platelets 219 150 - 400 K/uL   Neutrophils Relative % 79 %   Neutro Abs 9.0 (H) 1.7 - 7.7 K/uL   Lymphocytes Relative 16 %   Lymphs Abs 1.8 0.7 - 4.0 K/uL   Monocytes Relative 5 %  Monocytes Absolute 0.5 0.1 - 1.0 K/uL   Eosinophils Relative 0 %   Eosinophils Absolute 0.0 0.0 - 0.7 K/uL   Basophils Relative 0 %   Basophils Absolute 0.0 0.0 - 0.1 K/uL    Comment: Performed at [facility], [place] [address] [place], [place] [address]  APTT     Status: None   Collection Time: 04/24/18  8:56 AM  Result Value Ref Range   aPTT 24 24 - 36 seconds    Comment: Performed at [facility], [place] [address], [place], [place] [address]   Hemoglobin A1c     Status: Abnormal   Collection Time: 04/24/18  9:04 AM  Result Value Ref Range   Hgb A1c MFr Bld 5.8 (H) 4.8 - 5.6 %    Comment: (NOTE) Pre diabetes:          5.7%-6.4% Diabetes:              >6.4% Glycemic control for   <7.0% adults with diabetes    Mean Plasma Glucose 119.76 mg/dL    Comment: Performed at [place] [address], [place],  [address]  CBG monitoring, ED     Status: Abnormal   Collection Time: 04/24/18 11:41 AM  Result Value Ref Range   Glucose-Capillary 113 (H) 65 - 99 mg/dL   US Abdomen Complete  Result Date: 04/24/2018 CLINICAL DATA:  27 year old female with abdominal pain, nausea vomiting. EXAM: ABDOMEN ULTRASOUND COMPLETE COMPARISON:  Abdominal radiograph dated 04/24/2018 FINDINGS: Evaluation is limited due to patient's body habitus. Gallbladder: No gallstones or wall thickening visualized. No sonographic Murphy sign noted by sonographer. Probable trace free fluid adjacent to the gallbladder. Common bile duct: Diameter: 3 mm Liver: No focal lesion identified. Within normal limits in parenchymal echogenicity. Portal vein is patent on color Doppler imaging with normal direction of blood flow towards the liver. IVC: No abnormality visualized. Pancreas: Poorly visualized. Spleen: Size and appearance within normal limits. Right Kidney: Length: 11.5 cm. The kidneys poorly visualized. No hydronephrosis. Left Kidney: Length: 10.6 cm.  The left kidney is poorly visualized. Abdominal aorta: No aneurysm visualized. Other findings: Small perihepatic free fluid. IMPRESSION: Small perihepatic free fluid. No other abnormality noted within the intention of the exam. Electronically Signed   By: Anner Crete M.D.   On: 04/24/2018 06:35   Ct Abdomen Pelvis W Contrast  Result Date: 04/24/2018 CLINICAL DATA:  Acute right upper quadrant abdominal pain. EXAM: CT ABDOMEN AND PELVIS WITH CONTRAST TECHNIQUE: Multidetector CT imaging of the abdomen and pelvis  was performed using the standard protocol following bolus administration of intravenous contrast. CONTRAST:  162m ISOVUE-300 IOPAMIDOL (ISOVUE-300) INJECTION 61% COMPARISON:  Abdominal sonogram from earlier today. FINDINGS: Lower chest: No significant pulmonary nodules or acute consolidative airspace disease. Hepatobiliary: There is an 8.5 x 6.1 cm hypodense posterior right liver lobe mass (series 2/image 27) with irregular outer contour, which is directly contiguous with small to moderate volume posterior right perihepatic acute hyperdense hemoperitoneum. No additional liver masses. Normal gallbladder with no radiopaque cholelithiasis. No biliary ductal dilatation. Pancreas: Normal, with no mass or duct dilation. Spleen: Normal size. No mass. Adrenals/Urinary Tract: Normal adrenals. Normal kidneys with no hydronephrosis and no renal mass. Normal bladder. Stomach/Bowel: Normal non-distended stomach. Normal caliber small bowel with no small bowel wall thickening. Normal appendix. Normal large bowel with no diverticulosis, large bowel wall thickening or pericolonic fat stranding. Vascular/Lymphatic: Normal caliber abdominal aorta. Patent portal, splenic, hepatic and renal veins. No  pathologically enlarged lymph nodes in the abdomen or pelvis. Reproductive: Grossly normal uterus.  No adnexal mass. Other: No pneumoperitoneum. Small to moderate volume hemoperitoneum in the right paracolic gutter and pelvic cul-de-sac. Musculoskeletal: No aggressive appearing focal osseous lesions. No evidence of a fracture. IMPRESSION: 1. Large 8.5 cm hypodense indeterminate posterior right liver lobe mass, which is directly contiguous with small to moderate volume posterior right perihepatic acute hemoperitoneum. Differential includes acute hemorrhagic rupture of an underlying liver mass (such as an hepatic adenoma in a young female) into the perihepatic space versus traumatic liver laceration. Short-term follow-up MRI abdomen without  and with IV contrast recommended for further characterization. 2. Small to moderate volume hemoperitoneum in the right paracolic gutter and pelvic cul-de-sac. These results were called by telephone at the time of interpretation on 04/24/2018 at 8:05 am to [facility], who verbally acknowledged these results. Electronically Signed   By: Ilona Sorrel M.D.   On: 04/24/2018 08:07   Dg Abd 2 Views  Result Date: 04/24/2018 CLINICAL DATA:  27 y/o  F; mid abdominal pain, nausea, vomiting. EXAM: ABDOMEN - 2 VIEW COMPARISON:  None. FINDINGS: Nonobstructive bowel gas pattern. Large volume of stool in the colon may represent constipation. Bones are unremarkable. No pneumoperitoneum. IMPRESSION: Nonobstructive bowel gas pattern. Large volume of stool in the colon may represent constipation. Electronically Signed   By: Kristine Garbe M.D.   On: 04/24/2018 05:42    Pending Labs Unresulted Labs (From admission, onward)   Start     Ordered   04/25/18 0500  Comprehensive metabolic panel  Tomorrow morning,   R     04/24/18 0904   04/25/18 0500  CBC  Tomorrow morning,   R     04/24/18 0904   04/24/18 1200  CBC  Now then every 8 hours,   R     04/24/18 1014   04/24/18 0905  Lactic acid, plasma  STAT Now then every 3 hours,   R     04/24/18 0904   04/24/18 0901  HIV antibody (Routine Testing)  Once,   R     04/24/18 0904      Vitals/Pain Today's Vitals   04/24/18 0753 04/24/18 0907 04/24/18 1127 04/24/18 1137  BP:  (!) 133/96  (!) 129/93  Pulse:  (!) 118  (!) 120  Resp:  18  20  Temp:    98.3 F (36.8 C)  TempSrc:    Oral  SpO2:  100%  100%  PainSc: 5   6      Isolation Precautions No active isolations  Medications Medications  iopamidol (ISOVUE-300) 61 % injection (has no administration in time range)  0.9 %  sodium chloride infusion ( Intravenous New Bag/Given 04/24/18 1129)  ondansetron (ZOFRAN) tablet 4 mg (has no administration in time range)    Or  ondansetron (ZOFRAN)  injection 4 mg (has no administration in time range)  insulin aspart (novoLOG) injection 0-5 Units (has no administration in time range)  insulin aspart (novoLOG) injection 0-9 Units (0 Units Subcutaneous Not Given 04/24/18 1401)  HYDROmorphone (DILAUDID) injection 1 mg (1 mg Intravenous Given 04/24/18 1128)  gadobenate dimeglumine (MULTIHANCE) injection 20 mL (has no administration in time range)  LORazepam (ATIVAN) injection 1 mg (has no administration in time range)  sodium chloride 0.9 % bolus 1,000 mL (0 mLs Intravenous Stopped 04/24/18 0714)  HYDROmorphone (DILAUDID) injection 1 mg (1 mg Intravenous Given 04/24/18 0457)  ondansetron (ZOFRAN) injection 4 mg (4 mg Intravenous Given 04/24/18 0457)  HYDROmorphone (DILAUDID) injection 1 mg (1 mg Intravenous Given 04/24/18 0707)  famotidine (PEPCID) IVPB 20 mg premix (0 mg Intravenous Stopped 04/24/18 0753)  gi cocktail (Maalox,Lidocaine,Donnatal) (30 mLs Oral Given 04/24/18 0707)  sodium chloride 0.9 % bolus 1,000 mL (0 mLs Intravenous Stopped 04/24/18 0757)  iopamidol (ISOVUE-300) 61 % injection 100 mL (100 mLs Intravenous Contrast Given 04/24/18 0729)  dicyclomine (BENTYL) injection 20 mg (20 mg Intramuscular Given 04/24/18 0754)  sodium chloride 0.9 % bolus 1,000 mL (0 mLs Intravenous Stopped 04/24/18 1038)  LORazepam (ATIVAN) injection 1 mg (1 mg Intravenous Given 04/24/18 1253)    Mobility walks

## 2018-04-24 NOTE — Progress Notes (Signed)
Pt was lying in bed and awake when I arrived. Her friend arrived during our visit. Pt was very tearful during our visit. She said she is afraid she may have surgery. She said she thought she had food poisoning and was not expecting "all of this", referring to her diagnosis. She said her mother and grandmother are 2-3 hours away and were on the road to get to her. Pt is Jehovah's Witness but wanted prayer. She was appreciative of support and visit. Please page if additional support is needed prior to my next visit. Talty, MDiv   04/24/18 1100  Clinical Encounter Type  Visited With Patient and family together

## 2018-04-24 NOTE — ED Provider Notes (Signed)
[facility] DEPT Provider Note   CSN: [id number] Arrival date & time: 04/23/18  2302     History   Chief Complaint Chief Complaint  Patient presents with  . Abdominal Pain    HPI Latoya Kramer is a 27 y.o. female.  27 year old female with no significant past medical history presents to the emergency department for evaluation of abdominal pain.  She states that she was at a wedding tonight where she was drinking wine coolers and had chicken marsala with mashed potatoes.  She came home and began experiencing pain in her upper abdomen.  This was followed by a small, normal bowel movement and one large volume emesis.  Emesis nonbloody.  She has continued to have persistent nausea as well as constant abdominal pain.  Abdominal pain is worse with movement.  She did not take any medications prior to arrival for symptoms.  She has continued to experience belching.  No passing of flatus since pain began.  She denies recent fevers or associated urinary symptoms.  No history of abdominal surgeries.     Past Medical History:  Diagnosis Date  . Allergy   . Eczema     There are no active problems to display for this patient.   Past Surgical History:  Procedure Laterality Date  . EYE SURGERY       OB History   None      Home Medications    Prior to Admission medications   Medication Sig Start Date End Date Taking? Authorizing Provider  LO LOESTRIN FE 1 MG-10 MCG / 10 MCG tablet Take 1 tablet by mouth daily. 04/08/18  Yes [provider]  triamterene-hydrochlorothiazide (DYAZIDE) 37.5-25 MG capsule Take 1 capsule by mouth daily. 04/08/18  Yes [provider]    Family History History reviewed. No pertinent family history.  Social History Social History   Tobacco Use  . Smoking status: Never Smoker  Substance Use Topics  . Alcohol use: No  . Drug use: No     Allergies   Sulfa antibiotics   Review of  Systems Review of Systems Ten systems reviewed and are negative for acute change, except as noted in the HPI.    Physical Exam Updated Vital Signs BP 124/81 (BP Location: Right Arm)   Pulse (!) 101   Temp (!) 97.5 F (36.4 C) (Oral)   Resp 18   LMP 04/24/2018   SpO2 98%   Physical Exam  Constitutional: She is oriented to person, place, and time. She appears well-developed and well-nourished. No distress.  Uncomfortable appearing, nontoxic.  HENT:  Head: Normocephalic and atraumatic.  Eyes: Conjunctivae and EOM are normal. No scleral icterus.  Neck: Normal range of motion.  Cardiovascular: Normal rate, regular rhythm and intact distal pulses.  Pulmonary/Chest: Effort normal. No stridor. No respiratory distress. She has no wheezes.  Lungs clear to auscultation bilaterally.  Chest expansion symmetric.  Abdominal: She exhibits distension. She exhibits no mass. There is tenderness. There is guarding (voluntary). There is no rebound.  Soft, obese abdomen.  Slight distention.  Tenderness noted mostly in bilateral upper quadrants.  Exam limited secondary to discomfort.  Musculoskeletal: Normal range of motion.  Neurological: She is alert and oriented to person, place, and time. She exhibits normal muscle tone. Coordination normal.  GCS 15.  Patient moving all extremities.  Skin: Skin is warm and dry. No rash noted. She is not diaphoretic. No erythema. No pallor.  Psychiatric: She has a normal mood and affect.  Her behavior is normal.  Nursing note and vitals reviewed.    ED Treatments / Results  Labs (all labs ordered are listed, but only abnormal results are displayed) Labs Reviewed  COMPREHENSIVE METABOLIC PANEL - Abnormal; Notable for the following components:      Result Value   Glucose, Bld 154 (*)    AST 67 (*)    ALT 65 (*)    All other components within normal limits  CBC - Abnormal; Notable for the following components:   WBC 16.8 (*)    RBC 3.66 (*)    Hemoglobin  10.6 (*)    HCT 33.5 (*)    All other components within normal limits  URINALYSIS, ROUTINE W REFLEX MICROSCOPIC - Abnormal; Notable for the following components:   APPearance CLOUDY (*)    Hgb urine dipstick LARGE (*)    Ketones, ur 5 (*)    Protein, ur 100 (*)    Leukocytes, UA TRACE (*)    Bacteria, UA FEW (*)    All other components within normal limits  LIPASE, BLOOD  I-STAT BETA HCG BLOOD, ED ([facility], [facility], [facility] ONLY)    EKG None  Radiology US Abdomen Complete  Result Date: 04/24/2018 CLINICAL DATA:  26 year old female with abdominal pain, nausea vomiting. EXAM: ABDOMEN ULTRASOUND COMPLETE COMPARISON:  Abdominal radiograph dated 04/24/2018 FINDINGS: Evaluation is limited due to patient's body habitus. Gallbladder: No gallstones or wall thickening visualized. No sonographic Murphy sign noted by sonographer. Probable trace free fluid adjacent to the gallbladder. Common bile duct: Diameter: 3 mm Liver: No focal lesion identified. Within normal limits in parenchymal echogenicity. Portal vein is patent on color Doppler imaging with normal direction of blood flow towards the liver. IVC: No abnormality visualized. Pancreas: Poorly visualized. Spleen: Size and appearance within normal limits. Right Kidney: Length: 11.5 cm. The kidneys poorly visualized. No hydronephrosis. Left Kidney: Length: 10.6 cm.  The left kidney is poorly visualized. Abdominal aorta: No aneurysm visualized. Other findings: Small perihepatic free fluid. IMPRESSION: Small perihepatic free fluid. No other abnormality noted within the intention of the exam. Electronically Signed   By: Anner Crete M.D.   On: 04/24/2018 06:35   Dg Abd 2 Views  Result Date: 04/24/2018 CLINICAL DATA:  27 y/o  F; mid abdominal pain, nausea, vomiting. EXAM: ABDOMEN - 2 VIEW COMPARISON:  None. FINDINGS: Nonobstructive bowel gas pattern. Large volume of stool in the colon may represent constipation. Bones are unremarkable. No pneumoperitoneum. IMPRESSION:  Nonobstructive bowel gas pattern. Large volume of stool in the colon may represent constipation. Electronically Signed   By: Kristine Garbe M.D.   On: 04/24/2018 05:42    Procedures Procedures (including critical care time)  Medications Ordered in ED Medications  HYDROmorphone (DILAUDID) injection 1 mg (has no administration in time range)  famotidine (PEPCID) IVPB 20 mg premix (has no administration in time range)  gi cocktail (Maalox,Lidocaine,Donnatal) (has no administration in time range)  sodium chloride 0.9 % bolus 1,000 mL (has no administration in time range)  iopamidol (ISOVUE-300) 61 % injection 100 mL (has no administration in time range)  dicyclomine (BENTYL) injection 20 mg (has no administration in time range)  sodium chloride 0.9 % bolus 1,000 mL (1,000 mLs Intravenous New Bag/Given 04/24/18 0459)  HYDROmorphone (DILAUDID) injection 1 mg (1 mg Intravenous Given 04/24/18 0457)  ondansetron (ZOFRAN) injection 4 mg (4 mg Intravenous Given 04/24/18 0457)     Initial Impression / Assessment and Plan / ED Course  I have reviewed the triage vital  signs and the nursing notes.  Pertinent labs & imaging results that were available during my care of the patient were reviewed by me and considered in my medical decision making (see chart for details).     27 year old female presents to the emergency department for complaints of abdominal pain tonight.  This was associated with one episode of large-volume emesis.  She has been very uncomfortable since arrival and notably tachycardic.  Vitals otherwise stable without fever or hypotension.  Work-up thus far significant for leukocytosis.  Her x-ray does not show any evidence of free air or obstruction; large stool burden seen.  Most of her pain localized to the upper quadrants, but ultrasound does not reveal any concern for cholecystitis or choledocholithiasis.  Given persistent discomfort, will obtain CT scan for further evaluation.   Additional pain medication ordered as well as IV fluids.  Patient signed out to [employer] PA-C at change of shift who will follow-up on imaging and disposition appropriately.   Final Clinical Impressions(s) / ED Diagnoses   Final diagnoses:  Abdominal pain    ED Discharge Orders    None       Antonietta Breach, PA-C 04/24/18 0740    Molpus, Jenny Reichmann, MD 04/24/18 [phone]

## 2018-04-24 NOTE — H&P (Signed)
History and Physical    Latoya Kramer OHY:[id number] DOB: 01-Feb-1991 DOA: 04/24/2018  PCP: Patient, No Pcp Per Patient coming from: home  Chief Complaint: abdomianal pain  HPI: Latoya Kramer is a very pleasant 27 y.o. female with medical history significant of obesity and hypertension presents to ED with cc of abdominal pain. Initial evaluation reveals large right liver lobe mass with acute hemoperitoneum as well as anemia and tachycardia. [facility] are asked to ad  Information is obtained from the patient. She reports last night she right upper quadrant abdominal pain. She describes the pain as sharp cons is more comparable. She reports lying on her right side makes the pain worse. Yesterday she went to a wedding and had wine coolers and chicken Marsala and thought she may have food poisoning. She came to the emergency departent and had one episode of emesis. No coffee ground emesis. Reports being on her period. She denies headache dizziness syncope or near-syncope. She denie chest pain palpitations shortness of breath. She denies dysuria hematuria frequency or urgency. She reports she has had a "sinus infection" for 1 week. Taking OTC meds    ED Course: in the ED she is afebrile, tachycardia, hypertensive not hypoxic. She is given fluid and dilu  Review of Systems: As per HPI otherwise all other systems reviewed and are negative.   Ambulatory Status:ambulates independently  Past Medical History:  Diagnosis Date  . Allergy   . Anemia   . Eczema   . Hemoperitoneum, nontraumatic   . Liver mass     Past Surgical History:  Procedure Laterality Date  . EYE SURGERY      Social History   Socioeconomic History  . Marital status: Single    Spouse name: Not on file  . Number of children: Not on file  . Years of education: Not on file  . Highest education level: Not on file  Occupational History  . Not on file  Social Needs  . Financial resource strain:  Not on file  . Food insecurity:    Worry: Not on file    Inability: Not on file  . Transportation needs:    Medical: Not on file    Non-medical: Not on file  Tobacco Use  . Smoking status: Never Smoker  Substance and Sexual Activity  . Alcohol use: No  . Drug use: No  . Sexual activity: Yes  Lifestyle  . Physical activity:    Days per week: Not on file    Minutes per session: Not on file  . Stress: Not on file  Relationships  . Social connections:    Talks on phone: Not on file    Gets together: Not on file    Attends religious service: Not on file    Active member of club or organization: Not on file    Attends meetings of clubs or organizations: Not on file    Relationship status: Not on file  . Intimate partner violence:    Fear of current or ex partner: Not on file    Emotionally abused: Not on file    Physically abused: Not on file    Forced sexual activity: Not on file  Other Topics Concern  . Not on file  Social History Narrative  . Not on file    Allergies  Allergen Reactions  . Sulfa Antibiotics Other (See Comments)    unknown    History reviewed. No pertinent family history.  Prior to Admission medications  Medication Sig Start Date End Date Taking? Authorizing Provider  LO LOESTRIN FE 1 MG-10 MCG / 10 MCG tablet Take 1 tablet by mouth daily. 04/08/18  Yes [provider]  triamterene-hydrochlorothiazide (DYAZIDE) 37.5-25 MG capsule Take 1 capsule by mouth daily. 04/08/18  Yes [provider]    Physical Exam: Vitals:   04/24/18 0232 04/24/18 0502 04/24/18 0700 04/24/18 0907  BP: 123/88 124/81 (!) 123/102 (!) 133/96  Pulse: (!) 112 (!) 101 (!) 108 (!) 118  Resp: 16 18 18 18   Temp:      TempSrc:      SpO2: 100% 98% 100% 100%     General:  Appears anxious and comfortable in no acute distress Eyes:  PERRL, EOMI, normal lids, iris ENT:  grossly normal hearing, lips & tongue, mmm Neck:  no LAD, masses or  thyromegaly Cardiovascular:  Tachycardic but regular, no m/r/g. No LE edema.  Respiratory:  CTA bilaterally, no w/r/r. Normal respiratory effort. Abdomen:  Soft obese sluggish bowel sounds mild tenderness right upper quadrant o guarding or rebounding Skin:  no rash or induration seen on limited exam Musculoskeletal:  grossly normal tone BUE/BLE, good ROM, no bony abnormality Psychiatric:  grossly normal mood and affect, speech fluent and appropriate, AOx3 Neurologic:  CN 2-12 grossly intact, moves all extremities in coordinated fashion, sensation intact  Labs on Admission: I have personally reviewed following labs and imaging studies  CBC: Recent Labs  Lab 04/24/18 0231 04/24/18 0856  WBC 16.8* 11.3*  NEUTROABS  --  9.0*  HGB 10.6* 9.4*  HCT 33.5* 29.3*  MCV 91.5 90.7  PLT 272 270   Basic Metabolic Panel: Recent Labs  Lab 04/24/18 0231  NA 142  K 4.1  CL 107  CO2 24  GLUCOSE 154*  BUN 15  CREATININE 0.81  CALCIUM 9.5   GFR: CrCl cannot be calculated (Unknown ideal weight.). Liver Function Tests: Recent Labs  Lab 04/24/18 0231  AST 67*  ALT 65*  ALKPHOS 56  BILITOT 0.5  PROT 8.0  ALBUMIN 4.4   Recent Labs  Lab 04/24/18 0231  LIPASE 35   No results for input(s): AMMONIA in the last 168 hours. Coagulation Profile: Recent Labs  Lab 04/24/18 0856  INR 1.07   Cardiac Enzymes: No results for input(s): CKTOTAL, CKMB, CKMBINDEX, TROPONINI in the last 168 hours. BNP (last 3 results) No results for input(s): PROBNP in the last 8760 hours. HbA1C: No results for input(s): HGBA1C in the last 72 hours. CBG: No results for input(s): GLUCAP in the last 168 hours. Lipid Profile: No results for input(s): CHOL, HDL, LDLCALC, TRIG, CHOLHDL, LDLDIRECT in the last 72 hours. Thyroid Function Tests: No results for input(s): TSH, T4TOTAL, FREET4, T3FREE, THYROIDAB in the last 72 hours. Anemia Panel: No results for input(s): VITAMINB12, FOLATE, FERRITIN, TIBC, IRON,  RETICCTPCT in the last 72 hours. Urine analysis:    Component Value Date/Time   COLORURINE YELLOW 04/24/2018 0231   APPEARANCEUR CLOUDY (A) 04/24/2018 0231   LABSPEC 1.027 04/24/2018 0231   PHURINE 5.0 04/24/2018 0231   GLUCOSEU NEGATIVE 04/24/2018 0231   HGBUR LARGE (A) 04/24/2018 0231   BILIRUBINUR NEGATIVE 04/24/2018 0231   KETONESUR 5 (A) 04/24/2018 0231   PROTEINUR 100 (A) 04/24/2018 0231   NITRITE NEGATIVE 04/24/2018 0231   LEUKOCYTESUR TRACE (A) 04/24/2018 0231    Creatinine Clearance: CrCl cannot be calculated (Unknown ideal weight.).  Sepsis Labs: @LABRCNTIP (procalcitonin:4,lacticidven:4) )No results found for this or any previous visit (from the past 240 hour(s)).  Radiological Exams on Admission: US Abdomen Complete  Result Date: 04/24/2018 CLINICAL DATA:  27 year old female with abdominal pain, nausea vomiting. EXAM: ABDOMEN ULTRASOUND COMPLETE COMPARISON:  Abdominal radiograph dated 04/24/2018 FINDINGS: Evaluation is limited due to patient's body habitus. Gallbladder: No gallstones or wall thickening visualized. No sonographic Murphy sign noted by sonographer. Probable trace free fluid adjacent to the gallbladder. Common bile duct: Diameter: 3 mm Liver: No focal lesion identified. Within normal limits in parenchymal echogenicity. Portal vein is patent on color Doppler imaging with normal direction of blood flow towards the liver. IVC: No abnormality visualized. Pancreas: Poorly visualized. Spleen: Size and appearance within normal limits. Right Kidney: Length: 11.5 cm. The kidneys poorly visualized. No hydronephrosis. Left Kidney: Length: 10.6 cm.  The left kidney is poorly visualized. Abdominal aorta: No aneurysm visualized. Other findings: Small perihepatic free fluid. IMPRESSION: Small perihepatic free fluid. No other abnormality noted within the intention of the exam. Electronically Signed   By: Anner Crete M.D.   On: 04/24/2018 06:35   Ct Abdomen Pelvis W  Contrast  Result Date: 04/24/2018 CLINICAL DATA:  Acute right upper quadrant abdominal pain. EXAM: CT ABDOMEN AND PELVIS WITH CONTRAST TECHNIQUE: Multidetector CT imaging of the abdomen and pelvis was performed using the standard protocol following bolus administration of intravenous contrast. CONTRAST:  130mL ISOVUE-300 IOPAMIDOL (ISOVUE-300) INJECTION 61% COMPARISON:  Abdominal sonogram from earlier today. FINDINGS: Lower chest: No significant pulmonary nodules or acute consolidative airspace disease. Hepatobiliary: There is an 8.5 x 6.1 cm hypodense posterior right liver lobe mass (series 2/image 27) with irregular outer contour, which is directly contiguous with small to moderate volume posterior right perihepatic acute hyperdense hemoperitoneum. No additional liver masses. Normal gallbladder with no radiopaque cholelithiasis. No biliary ductal dilatation. Pancreas: Normal, with no mass or duct dilation. Spleen: Normal size. No mass. Adrenals/Urinary Tract: Normal adrenals. Normal kidneys with no hydronephrosis and no renal mass. Normal bladder. Stomach/Bowel: Normal non-distended stomach. Normal caliber small bowel with no small bowel wall thickening. Normal appendix. Normal large bowel with no diverticulosis, large bowel wall thickening or pericolonic fat stranding. Vascular/Lymphatic: Normal caliber abdominal aorta. Patent portal, splenic, hepatic and renal veins. No pathologically enlarged lymph nodes in the abdomen or pelvis. Reproductive: Grossly normal uterus.  No adnexal mass. Other: No pneumoperitoneum. Small to moderate volume hemoperitoneum in the right paracolic gutter and pelvic cul-de-sac. Musculoskeletal: No aggressive appearing focal osseous lesions. No evidence of a fracture. IMPRESSION: 1. Large 8.5 cm hypodense indeterminate posterior right liver lobe mass, which is directly contiguous with small to moderate volume posterior right perihepatic acute hemoperitoneum. Differential includes acute  hemorrhagic rupture of an underlying liver mass (such as an hepatic adenoma in a young female) into the perihepatic space versus traumatic liver laceration. Short-term follow-up MRI abdomen without and with IV contrast recommended for further characterization. 2. Small to moderate volume hemoperitoneum in the right paracolic gutter and pelvic cul-de-sac. These results were called by telephone at the time of interpretation on 04/24/2018 at 8:05 am to [facility], who verbally acknowledged these results. Electronically Signed   By: Ilona Sorrel M.D.   On: 04/24/2018 08:07   Dg Abd 2 Views  Result Date: 04/24/2018 CLINICAL DATA:  27 y/o  F; mid abdominal pain, nausea, vomiting. EXAM: ABDOMEN - 2 VIEW COMPARISON:  None. FINDINGS: Nonobstructive bowel gas pattern. Large volume of stool in the colon may represent constipation. Bones are unremarkable. No pneumoperitoneum. IMPRESSION: Nonobstructive bowel gas pattern. Large volume of stool in the colon may represent constipation. Electronically  Signed   By: Kristine Garbe M.D.   On: 04/24/2018 05:42    EKG:   Assessment/Plan Principal Problem:   Hemoperitoneum, nontraumatic Active Problems:   Anemia   Liver mass   Tachycardia   Hyperglycemia   Leukocytosis   Hypertension   Obesity   #1. Hemoperitoneum nontraumatic. CT as noted above. general surgery reviewed CT scan opined not likely surgery needed recommended medical admission. Home meds include LO LOESTRIN FE. No trauma per patient -Admit to step down -Obtain an MRI -Nothing by mouth -Supportive therapy -Monitor closely  #2. Anemia, acute blood loss. Secondary to #1. Hemoglobin 10.6 on admission. Of note patient also on her period. In addition she is Jehovah witness -serial cbc  #3. Leukocytosis, tachycardia. Likely related to above. She is afebrile non toxic appearing -lactic acid -IV fluids -no antibiotics at this time -monitor  #4. Liver mass. CT as noted  above -MRI -supportive therapy  #5.hypertension. Only fair control. Home meds include triamterene-HCTZ.  -hold home meds for now -prn hydralazine -consider BB if no improvement  #6. Obesity -nutritional consult   DVT prophylaxis: scd Code Status: full  Family Communication: none  Disposition Plan: home  Consults called: [facility] general surgery  Admission status: inpatient    Radene Gunning MD [facility]  If 7PM-7AM, please contact night-coverage [email or web address] Password [email or web address]  04/24/2018, 10:11 AM

## 2018-04-25 LAB — VITAMIN B12: VITAMIN B 12: 284 pg/mL (ref 180–914)

## 2018-04-25 LAB — COMPREHENSIVE METABOLIC PANEL
ALT: 420 U/L — ABNORMAL HIGH (ref 14–54)
AST: 273 U/L — ABNORMAL HIGH (ref 15–41)
Albumin: 3.6 g/dL (ref 3.5–5.0)
Alkaline Phosphatase: 63 U/L (ref 38–126)
Anion gap: 8 (ref 5–15)
BUN: 6 mg/dL (ref 6–20)
CHLORIDE: 104 mmol/L (ref 101–111)
CO2: 26 mmol/L (ref 22–32)
Calcium: 8.5 mg/dL — ABNORMAL LOW (ref 8.9–10.3)
Creatinine, Ser: 0.78 mg/dL (ref 0.44–1.00)
GFR calc non Af Amer: >60 mL/min (ref >60)
Glucose, Bld: 125 mg/dL — ABNORMAL HIGH (ref 65–99)
Potassium: 3.6 mmol/L (ref 3.5–5.1)
SODIUM: 138 mmol/L (ref 135–145)
Total Bilirubin: 0.4 mg/dL (ref 0.3–1.2)
Total Protein: 6.3 g/dL — ABNORMAL LOW (ref 6.5–8.1)

## 2018-04-25 LAB — CBC
HCT: 27 % — ABNORMAL LOW (ref 36.0–46.0)
HEMATOCRIT: 26.2 % — AB (ref 36.0–46.0)
HEMOGLOBIN: 8.4 g/dL — AB (ref 12.0–15.0)
Hemoglobin: 8.7 g/dL — ABNORMAL LOW (ref 12.0–15.0)
MCH: 29.2 pg (ref 26.0–34.0)
MCH: 29.2 pg (ref 26.0–34.0)
MCHC: 32.2 g/dL (ref 30.0–36.0)
MCHC: 32.1 g/dL (ref 30.0–36.0)
MCV: 90.6 fL (ref 78.0–100.0)
MCV: 91 fL (ref 78.0–100.0)
PLATELETS: 190 10*3/uL (ref 150–400)
Platelets: 201 10*3/uL (ref 150–400)
RBC: 2.98 MIL/uL — ABNORMAL LOW (ref 3.87–5.11)
RBC: 2.88 MIL/uL — ABNORMAL LOW (ref 3.87–5.11)
RDW: 13.9 % (ref 11.5–15.5)
RDW: 14.2 % (ref 11.5–15.5)
WBC: 11.3 10*3/uL — ABNORMAL HIGH (ref 4.0–10.5)
WBC: 10.6 10*3/uL — AB (ref 4.0–10.5)

## 2018-04-25 LAB — IRON AND TIBC
Iron: 16 ug/dL — ABNORMAL LOW (ref 28–170)
Saturation Ratios: 4 % — ABNORMAL LOW (ref 10.4–31.8)
TIBC: 374 ug/dL (ref 250–450)
UIBC: 358 ug/dL

## 2018-04-25 LAB — FOLATE: FOLATE: 12.2 ng/mL (ref >5.9)

## 2018-04-25 LAB — GLUCOSE, CAPILLARY
GLUCOSE-CAPILLARY: 126 mg/dL — AB (ref 65–99)
GLUCOSE-CAPILLARY: 110 mg/dL — AB (ref 65–99)
Glucose-Capillary: 123 mg/dL — ABNORMAL HIGH (ref 65–99)
Glucose-Capillary: 116 mg/dL — ABNORMAL HIGH (ref 65–99)

## 2018-04-25 LAB — RETICULOCYTES
RBC.: 2.88 MIL/uL — AB (ref 3.87–5.11)
RETIC COUNT ABSOLUTE: 66.2 10*3/uL (ref 19.0–186.0)
Retic Ct Pct: 2.3 % (ref 0.4–3.1)

## 2018-04-25 LAB — FERRITIN: Ferritin: 100 ng/mL (ref 11–307)

## 2018-04-25 MED ORDER — ALPRAZOLAM 0.25 MG PO TABS
0.2500 mg | ORAL_TABLET | Freq: Three times a day (TID) | ORAL | Status: DC | PRN
Start: 2018-04-25 — End: 2018-05-01
  Administered 2018-04-25 – 2018-05-01 (×3): 0.25 mg via ORAL
  Filled 2018-04-25 (×3): qty 1

## 2018-04-25 MED ORDER — METOPROLOL TARTRATE 12.5 MG HALF TABLET
12.5000 mg | ORAL_TABLET | Freq: Two times a day (BID) | ORAL | Status: DC
Start: 2018-04-25 — End: 2018-04-27
  Administered 2018-04-25 – 2018-04-27 (×5): 12.5 mg via ORAL
  Filled 2018-04-25 (×5): qty 1

## 2018-04-25 MED ORDER — SODIUM CHLORIDE 0.9 % IV SOLN: INTRAVENOUS | Status: DC

## 2018-04-25 MED ORDER — ACETAMINOPHEN 325 MG PO TABS
650.0000 mg | ORAL_TABLET | Freq: Four times a day (QID) | ORAL | Status: DC | PRN
  Administered 2018-04-25 – 2018-04-29 (×8): 650 mg via ORAL
  Filled 2018-04-25 (×8): qty 2

## 2018-04-25 MED ORDER — KCL IN DEXTROSE-NACL 20-5-0.9 MEQ/L-%-% IV SOLN
INTRAVENOUS | Status: DC
  Administered 2018-04-25 – 2018-04-28 (×5): via INTRAVENOUS
  Filled 2018-04-25 (×7): qty 1000

## 2018-04-25 MED ORDER — SODIUM CHLORIDE 0.9 % IV BOLUS
500.0000 mL | Freq: Once | INTRAVENOUS | Status: AC
  Administered 2018-04-25: 500 mL via INTRAVENOUS

## 2018-04-25 MED ORDER — LORAZEPAM 2 MG/ML IJ SOLN: 0.5000 mg | Freq: Three times a day (TID) | INTRAMUSCULAR | Status: DC | PRN

## 2018-04-25 NOTE — Progress Notes (Signed)
Spoke with Dr watts in IR. They are now aware of pt. Will evaluate in AM and avail earlier if needed for transarterial embolization should she become unstable.

## 2018-04-25 NOTE — Progress Notes (Signed)
Patient ID: Latoya Kramer, female   DOB: December 24, 1990, 27 y.o.   MRN: [id number]   Acute Care Surgery Service Progress Note:    Chief Complaint/Subjective: Some Rt sided discomfort. No n/v. Mom at [facility] since admission   Objective: Vital signs in last 24 hours: Temp:  [98.3 F (36.8 C)-99.2 F (37.3 C)] 99.2 F (37.3 C) (05/06 0522) Pulse Rate:  [107-123] 107 (05/05 2211) Resp:  [17-24] 24 (05/05 2211) BP: (107-129)/(65-98) 107/65 (05/06 0200) SpO2:  [97 %-100 %] 97 % (05/05 2211) Last BM Date: 04/23/18  Intake/Output from previous day: 05/05 0701 - 05/06 0700 In: 1950 [I.V.:900; IV Piggyback:1050] Out: -  Intake/Output this shift: No intake/output data recorded.  Lungs: cta, nonlabored  Cardiovascular: tachy  Abd: soft, obese, mild RUQ TTP  Extremities: no edema, +SCDs  Neuro: alert, nonfocal  Lab Results: CBC  Recent Labs    04/24/18 0856 04/24/18 1651  WBC 11.3* 10.3  HGB 9.4* 8.8*  HCT 29.3* 27.6*  PLT 219 198   CBC Latest Ref Rng & Units 04/24/2018 04/24/2018 04/24/2018  WBC 4.0 - 10.5 K/uL 10.3 11.3(H) 16.8(H)  Hemoglobin 12.0 - 15.0 g/dL 8.8(L) 9.4(L) 10.6(L)  Hematocrit 36.0 - 46.0 % 27.6(L) 29.3(L) 33.5(L)  Platelets 150 - 400 K/uL 198 219 272    BMET Recent Labs    04/24/18 0231  NA 142  K 4.1  CL 107  CO2 24  GLUCOSE 154*  BUN 15  CREATININE 0.81  CALCIUM 9.5   LFT Hepatic Function Latest Ref Rng & Units 04/24/2018  Total Protein 6.5 - 8.1 g/dL 8.0  Albumin 3.5 - 5.0 g/dL 4.4  AST 15 - 41 U/L 67(H)  ALT 14 - 54 U/L 65(H)  Alk Phosphatase 38 - 126 U/L 56  Total Bilirubin 0.3 - 1.2 mg/dL 0.5   PT/INR Recent Labs    04/24/18 0856  LABPROT 13.8  INR 1.07   ABG No results for input(s): PHART, HCO3 in the last 72 hours.  Invalid input(s): PCO2, PO2  Studies/Results:  Anti-infectives: Anti-infectives (From admission, onward)   None      Medications: Scheduled Meds: . insulin aspart  0-5 Units Subcutaneous QHS  .  insulin aspart  0-9 Units Subcutaneous TID WC  . LORazepam  1 mg Intravenous Once   Continuous Infusions: . sodium chloride 75 mL/hr at 04/25/18 0812   PRN Meds:.HYDROmorphone (DILAUDID) injection, ondansetron **OR** ondansetron (ZOFRAN) IV  Assessment/Plan: Patient Active Problem List   Diagnosis Date Noted  . Hemoperitoneum, nontraumatic 04/24/2018  . Tachycardia 04/24/2018  . Hyperglycemia 04/24/2018  . Leukocytosis 04/24/2018  . Hypertension 04/24/2018  . Obesity 04/24/2018  . Anemia   . 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   Long conversation with mother and pt.  Both confirm absolutely no blood products even in setting of major bleed/shock Will accept blood alternatives Discussed typical mgmt of bleeding hepatic adenomas - surgery vs transarterial embolization.   Given her last hgb was 8.8 I really don't think surgical resection is an option given the fact that she will not accept blood products. Believe risk of complications/morbidity/mortality are too high with surgery given inability to administer blood products  Will f/u on this AM blood  count and then will discuss case with IR about TAE Discussed pt's situation with a family friend who is a physician in [facility] on the phone. He agreed with IR approach if needed.   Updated Dr Thereasa Solo about pt's status  Pt is anxious - will give prn low dose ativan  NPO except ice, sips of water until know labs and discuss case with IR  Total time 45 minutes, counseling pt and mother, coordinating care  Disposition:  LOS: 1 day    Leighton Ruff. Redmond Pulling, MD, FACS General, Bariatric, & Minimally Invasive Surgery [phone] [facility]

## 2018-04-25 NOTE — Progress Notes (Signed)
[place] TEAM 1 - Stepdown/ICU TEAM  Shemaiah Moon Budde  FFM:[id number] DOB: 09/24/1991 DOA: 04/24/2018 PCP: Patient, No Pcp Per    Brief Narrative:  27yo F Jehovah's Witness w/ a hx of HTN and obesity who presented to the ED w/ abdominal pain.  CT abdom noted a posterior right liver lobe mass with moderate volume perihepatic acute hemoperitoneum.    Significant Events: 5/5 admit w/ hemopritoneum   Subjective: The patient denies chest pain shortness of breath fevers or chills.  She reports vague right-sided abdominal pain.  She also reports a prominent diffuse headache.  The patient her mother and I have had a lengthy discussion concerning treatment options for her significant acute blood loss anemia.  She has confirmed that she would not accept a blood transfusion.  We are presently waiting to hear from interventional radiology as to the potential benefit of an embolization procedure.  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 - SIRS  General surgery following and understandably reluctant to consider surgical interaction given the patient's refusal of blood products -IR to advise on possibility/utility of embolization -continue to follow hemoglobin in the serial fashion  Acute blood loss anemia  Due to intra-abdominal bleeding as discussed above -follow hemoglobin in serial fashion -check iron T70 and folic acid and supplement as indicated -as discussed above the patient will not accept blood transfusions -attempt to minimize phlebotomy but at the same time must follow hemoglobin in serial fashion at this time  Recent Labs  Lab 04/24/18 0231 04/24/18 0856 04/24/18 1651 04/25/18 0956  HGB 10.6* 9.4* 8.8* 8.7*    HTN blood pressure stable  Morbid obesity   DVT prophylaxis: SCDs Code Status: FULL CODE Family Communication: Discussed care with patient and mother at bedside at length Disposition Plan: SDU  Consultants:  Gen Surgery     Antimicrobials:  none   Objective: Blood pressure 107/65, pulse (!) 107, temperature (!) 100.4 F (38 C), resp. rate (!) 24, last menstrual period 04/24/2018, SpO2 97 %.  Intake/Output Summary (Last 24 hours) at 04/25/2018 1458 Last data filed at 04/25/2018 1022 Gross per 24 hour  Intake 900 ml  Output -  Net 900 ml   There were no vitals filed for this visit.  Examination: General: No acute respiratory distress Lungs: Clear to auscultation bilaterally without wheezes or crackles Cardiovascular: Regular rate and rhythm without murmur gallop or rub normal S1 and S2 Abdomen: mildly tender across R abdom - no rebound - bs+ - no mass  Extremities: No significant cyanosis, clubbing, or edema bilateral lower extremities  CBC: Recent Labs  Lab 04/24/18 0856 04/24/18 1651 04/25/18 0956  WBC 11.3* 10.3 11.3*  NEUTROABS 9.0*  --   --   HGB 9.4* 8.8* 8.7*  HCT 29.3* 27.6* 27.0*  MCV 90.7 91.4 90.6  PLT 219 198 177   Basic Metabolic Panel: Recent Labs  Lab 04/24/18 0231 04/25/18 0956  NA 142 138  K 4.1 3.6  CL 107 104  CO2 24 26  GLUCOSE 154* 125*  BUN 15 6  CREATININE 0.81 0.78  CALCIUM 9.5 8.5*   GFR: CrCl cannot be calculated (Unknown ideal weight.).  Liver Function Tests: Recent Labs  Lab 04/24/18 0231 04/25/18 0956  AST 67* 273*  ALT 65* 420*  ALKPHOS 56 63  BILITOT 0.5 0.4  PROT 8.0 6.3*  ALBUMIN 4.4 3.6   Recent Labs  Lab 04/24/18 0231  LIPASE 35   Coagulation Profile: Recent Labs  Lab 04/24/18 0856  INR 1.07    HbA1C: Hgb A1c MFr Bld  Date/Time Value Ref Range Status  04/24/2018 09:04 AM 5.8 (H) 4.8 - 5.6 % Final    Comment:    (NOTE) Pre diabetes:          5.7%-6.4% Diabetes:              >6.4% Glycemic control for   <7.0% adults with diabetes     CBG: Recent Labs  Lab 04/24/18 1141 04/24/18 1747 04/24/18 2155 04/25/18 0818 04/25/18 1207  GLUCAP 113* 84 106* 123* 126*    Scheduled Meds: . insulin aspart  0-5 Units  Subcutaneous QHS  . insulin aspart  0-9 Units Subcutaneous TID WC  . metoprolol tartrate  12.5 mg Oral BID     LOS: 1 day   Cherene Altes, MD [facility] Office  [phone] Pager - Text Page per Shea Evans as per below:  On-Call/Text Page:      [email or web address]      password [email or web address]  If 7PM-7AM, please contact night-coverage [email or web address] Password [email or web address] 04/25/2018, 2:58 PM

## 2018-04-25 NOTE — Progress Notes (Signed)
Patient's HR up to 170's. Sustaining in 140's. Patient up moving around room. Asked her to sit and HR came down further. Dr. Thereasa Solo notified and will continue to monitor.

## 2018-04-26 LAB — HIV ANTIBODY (ROUTINE TESTING W REFLEX): HIV SCREEN 4TH GENERATION: NONREACTIVE

## 2018-04-26 LAB — CBC
HCT: 24.4 % — ABNORMAL LOW (ref 36.0–46.0)
HEMOGLOBIN: 8 g/dL — AB (ref 12.0–15.0)
MCH: 29.7 pg (ref 26.0–34.0)
MCHC: 32.8 g/dL (ref 30.0–36.0)
MCV: 90.7 fL (ref 78.0–100.0)
PLATELETS: 182 10*3/uL (ref 150–400)
RBC: 2.69 MIL/uL — AB (ref 3.87–5.11)
RDW: 14 % (ref 11.5–15.5)
WBC: 9.9 10*3/uL (ref 4.0–10.5)

## 2018-04-26 LAB — GLUCOSE, CAPILLARY
Glucose-Capillary: 126 mg/dL — ABNORMAL HIGH (ref 65–99)
Glucose-Capillary: 125 mg/dL — ABNORMAL HIGH (ref 65–99)
Glucose-Capillary: 113 mg/dL — ABNORMAL HIGH (ref 65–99)
Glucose-Capillary: 99 mg/dL (ref 65–99)

## 2018-04-26 MED ORDER — CYANOCOBALAMIN 1000 MCG/ML IJ SOLN
1000.0000 ug | Freq: Every day | INTRAMUSCULAR | Status: AC
  Administered 2018-04-26 – 2018-04-28 (×3): 1000 ug via SUBCUTANEOUS
  Filled 2018-04-26 (×3): qty 1

## 2018-04-26 MED ORDER — FOLIC ACID 1 MG PO TABS
1.0000 mg | ORAL_TABLET | Freq: Every day | ORAL | Status: DC
  Administered 2018-04-26 – 2018-05-01 (×6): 1 mg via ORAL
  Filled 2018-04-26 (×6): qty 1

## 2018-04-26 MED ORDER — SODIUM CHLORIDE 0.9 % IV SOLN
510.0000 mg | Freq: Once | INTRAVENOUS | Status: AC
  Administered 2018-04-26: 510 mg via INTRAVENOUS
  Filled 2018-04-26: qty 17

## 2018-04-26 MED ORDER — DARBEPOETIN ALFA 200 MCG/0.4ML IJ SOSY
200.0000 ug | PREFILLED_SYRINGE | INTRAMUSCULAR | Status: DC
  Administered 2018-04-26: 200 ug via SUBCUTANEOUS
  Filled 2018-04-26: qty 0.4

## 2018-04-26 MED ORDER — SENNOSIDES-DOCUSATE SODIUM 8.6-50 MG PO TABS
2.0000 | ORAL_TABLET | Freq: Every day | ORAL | Status: DC
  Administered 2018-04-26 – 2018-04-29 (×4): 2 via ORAL
  Filled 2018-04-26 (×5): qty 2

## 2018-04-26 NOTE — Progress Notes (Signed)
Patient ID: Latoya Kramer, female   DOB: 10-Mar-1991, 27 y.o.   MRN: [id number]   Acute Care Surgery Service Progress Note:    Chief Complaint/Subjective: Low grade temp overnight No n/v Less abd discomfort hgb stable  Objective: Vital signs in last 24 hours: Temp:  [99 F (37.2 C)-101.9 F (38.8 C)] 99 F (37.2 C) (05/07 0829) Pulse Rate:  [107-131] 107 (05/07 0010) Resp:  [26-37] 26 (05/07 0010) BP: (118-132)/(71-91) 118/71 (05/07 0010) SpO2:  [95 %-99 %] 95 % (05/07 0010) Last BM Date: 04/23/18  Intake/Output from previous day: 05/06 0701 - 05/07 0700 In: 639.6 [P.O.:220; I.V.:419.6] Out: -  Intake/Output this shift: No intake/output data recorded.  Lungs: cta, nonlabored  Cardiovascular: reg   Abd: soft, obese, very min TTP - less   Extremities: no edema, +SCDs  Neuro: asleep, easily awakens, nonfocal  Lab Results: CBC  Recent Labs    04/25/18 1505 04/26/18 0435  WBC 10.6* 9.9  HGB 8.4* 8.0*  HCT 26.2* 24.4*  PLT 201 182   BMET Recent Labs    04/24/18 0231 04/25/18 0956  NA 142 138  K 4.1 3.6  CL 107 104  CO2 24 26  GLUCOSE 154* 125*  BUN 15 6  CREATININE 0.81 0.78  CALCIUM 9.5 8.5*   LFT Hepatic Function Latest Ref Rng & Units 04/25/2018 04/24/2018  Total Protein 6.5 - 8.1 g/dL 6.3(L) 8.0  Albumin 3.5 - 5.0 g/dL 3.6 4.4  AST 15 - 41 U/L 273(H) 67(H)  ALT 14 - 54 U/L 420(H) 65(H)  Alk Phosphatase 38 - 126 U/L 63 56  Total Bilirubin 0.3 - 1.2 mg/dL 0.4 0.5   PT/INR Recent Labs    04/24/18 0856  LABPROT 13.8  INR 1.07   ABG No results for input(s): PHART, HCO3 in the last 72 hours.  Invalid input(s): PCO2, PO2  Studies/Results:  Anti-infectives: Anti-infectives (From admission, onward)   None      Medications: Scheduled Meds: . metoprolol tartrate  12.5 mg Oral BID   Continuous Infusions: . dextrose 5 % and 0.9 % NaCl with KCl 20 mEq/L 125 mL/hr at 04/26/18 0038   PRN Meds:.acetaminophen, ALPRAZolam,  HYDROmorphone (DILAUDID) injection, ondansetron **OR** ondansetron (ZOFRAN) IV  Assessment/Plan: Patient Active Problem List   Diagnosis Date Noted  . Hemoperitoneum, nontraumatic 04/24/2018  . Tachycardia 04/24/2018  . Hyperglycemia 04/24/2018  . Leukocytosis 04/24/2018  . Hypertension 04/24/2018  . Obesity 04/24/2018  . Anemia   . Liver mass     9 x8 x 6 Large right hepatic mass with hemorrhage - prob adenoma Rt hepatic FNH mass - seg 6 Multiple other hepatic adenomas OCP use Acute blood loss anemia Hemoperitoneum Jehovah witness  hgb essentially stable at around 8 (she is on 125cc/hr mivf) rec Transarterial embolization given large hepatic adenoma, low hgb and pt is Jehovah witness and will not accept prbc/ffp, etc.   I think TAE will help decrease risk of further bleeding of hepatic adenoma and may perhaps shrink adenoma.  Then pt can hopefully get 2nd opinon at [facility] as outpatient  Consider starting Epo/ other measures to boost blood count Would back off frequency of blood draws after IR procedure  Can have clears after IR procedure  Disposition:  LOS: 2 days    Leighton Ruff. Redmond Pulling, MD, FACS General, Bariatric, & Minimally Invasive Surgery [phone] [facility]

## 2018-04-26 NOTE — Progress Notes (Signed)
[place] TEAM 1 - Stepdown/ICU TEAM  Latoya Kramer  OVF:[id number] DOB: [birth year] DOA: 04/24/2018 PCP: Patient, No Pcp Per    Brief Narrative:  27yo F Jehovah's Witness w/ a hx of HTN and obesity who presented to the ED w/ abdominal pain.  CT abdom noted a posterior right liver lobe mass with moderate volume perihepatic acute hemoperitoneum.    Significant Events: 5/5 admit w/ hemopritoneum   Subjective: The patient is resting comfortably in bed.  She is tolerating clear liquids without difficulty.  She reports that her headache is much improved but not entirely resolved.  She continues to have right flank abdominal pain but no nausea or vomiting.  She has not had a bowel movement since her admission but has been limited to small-volume clears.  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 - SIRS  General surgery following and understandably reluctant to consider surgical interaction given the patient's refusal of blood products -IR to proceed w/ embolization 5/8 -continue to follow hemoglobin in the serial fashion  Acute blood loss anemia  Due to intra-abdominal bleeding as discussed above - the patient will not accept blood transfusions -attempt to minimize phlebotomy but at the same time must follow hemoglobin in serial fashion at this time - supplement A41, folic acid, and dose w/ IV Fe - begin epo   Recent Labs  Lab 04/24/18 0231 04/24/18 0856 04/24/18 1651 04/25/18 0956 04/25/18 1505 04/26/18 0435  HGB 10.6* 9.4* 8.8* 8.7* 8.4* 8.0*    HTN blood pressure stable  Morbid obesity   DVT prophylaxis: SCDs Code Status: FULL CODE Family Communication: Discussed care with patient and mother at bedside at great length Disposition Plan: SDU  Consultants:  Gen Surgery   IR  Antimicrobials:  none   Objective: Blood pressure 115/71, pulse (!) 119, temperature 99 F (37.2 C), resp. rate (!) 24, last menstrual period 04/24/2018, SpO2 96  %.  Intake/Output Summary (Last 24 hours) at 04/26/2018 1542 Last data filed at 04/26/2018 1300 Gross per 24 hour  Intake 639.58 ml  Output -  Net 639.58 ml   There were no vitals filed for this visit.  Examination: General: No acute respiratory distress - alert and oriented  Lungs: Clear to auscultation bilaterally- no wheezing  Cardiovascular: tachycardic but regular - no M or rub  Abdomen: mildly tender across R abdom w/o change - no rebound - bs+ - no mass  Extremities: No signif edema bilateral lower extremities  CBC: Recent Labs  Lab 04/24/18 0856  04/25/18 0956 04/25/18 1505 04/26/18 0435  WBC 11.3*   < > 11.3* 10.6* 9.9  NEUTROABS 9.0*  --   --   --   --   HGB 9.4*   < > 8.7* 8.4* 8.0*  HCT 29.3*   < > 27.0* 26.2* 24.4*  MCV 90.7   < > 90.6 91.0 90.7  PLT 219   < > 190 201 182   < > = values in this interval not displayed.   Basic Metabolic Panel: Recent Labs  Lab 04/24/18 0231 04/25/18 0956  NA 142 138  K 4.1 3.6  CL 107 104  CO2 24 26  GLUCOSE 154* 125*  BUN 15 6  CREATININE 0.81 0.78  CALCIUM 9.5 8.5*   GFR: CrCl cannot be calculated (Unknown ideal weight.).  Liver Function Tests: Recent Labs  Lab 04/24/18 0231 04/25/18 0956  AST 67* 273*  ALT 65* 420*  ALKPHOS 56 63  BILITOT 0.5 0.4  PROT 8.0 6.3*  ALBUMIN 4.4 3.6   Recent Labs  Lab 04/24/18 0231  LIPASE 35   Coagulation Profile: Recent Labs  Lab 04/24/18 0856  INR 1.07    HbA1C: Hgb A1c MFr Bld  Date/Time Value Ref Range Status  04/24/2018 09:04 AM 5.8 (H) 4.8 - 5.6 % Final    Comment:    (NOTE) Pre diabetes:          5.7%-6.4% Diabetes:              >6.4% Glycemic control for   <7.0% adults with diabetes     CBG: Recent Labs  Lab 04/25/18 1207 04/25/18 1657 04/25/18 2153 04/26/18 0817 04/26/18 1239  GLUCAP 126* 116* 110* 126* 125*    Scheduled Meds: . metoprolol tartrate  12.5 mg Oral BID     LOS: 2 days   Cherene Altes, MD [facility] Office  [phone] Pager - Text Page per Shea Evans as per below:  On-Call/Text Page:      [email or web address]      password [email or web address]  If 7PM-7AM, please contact night-coverage [email or web address] Password [facility] 04/26/2018, 3:42 PM

## 2018-04-26 NOTE — Plan of Care (Signed)
Still complaining  of pain

## 2018-04-26 NOTE — Consult Note (Signed)
Chief Complaint: Patient was seen in consultation today for hepatic adenoma embolization Chief Complaint  Patient presents with  . Abdominal Pain   at the request of Dr Ok Anis   Supervising Physician: Daryll Brod  Patient Status: [facility] - In-pt  History of Present Illness: Latoya Kramer is a 27 y.o. female   To ED 5/5 with Abd pain  CT:  IMPRESSION: 1. Large 8.5 cm hypodense indeterminate posterior right liver lobe mass, which is directly contiguous with small to moderate volume posterior right perihepatic acute hemoperitoneum. Differential includes acute hemorrhagic rupture of an underlying liver mass (such as an hepatic adenoma in a young female) into the perihepatic space versus traumatic liver laceration. Short-term follow-up MRI abdomen without and with IV contrast recommended for further characterization. 2. Small to moderate volume hemoperitoneum in the right paracolic gutter and pelvic cul-de-sac.  Denies injury Surgery consulted Dr Redmond Pulling note today: Given her last hgb was 8.8 I really don't think surgical resection is an option given the fact that she will not accept blood products. Believe risk of complications/morbidity/mortality are too high with surgery given inability to administer blood products Reluctant to do surgery in Jehovah Witness pt-- refusal of blood products  IR was consulted for consideration of Hepatic adenoma embolization Dr Pascal Lux and Dr Annamaria Boots have reviewed imaging Have approved procedure -- probable in am  Hg 8 this am (8.8; 8.7) abd pain same--- not worsening BP stable 115/71 (118/71)  Discussed with Mother; pt and cousin (who is a Marine scientist- on phone)   Past Medical History:  Diagnosis Date  . Allergy   . Anemia   . Eczema   . Hemoperitoneum, nontraumatic   . Liver mass     Past Surgical History:  Procedure Laterality Date  . EYE SURGERY      Allergies: Sulfa antibiotics  Medications: Prior to Admission  medications   Medication Sig Start Date End Date Taking? Authorizing Provider  LO LOESTRIN FE 1 MG-10 MCG / 10 MCG tablet Take 1 tablet by mouth daily. 04/08/18  Yes [provider]  triamterene-hydrochlorothiazide (DYAZIDE) 37.5-25 MG capsule Take 1 capsule by mouth daily. 04/08/18  Yes [provider]     History reviewed. No pertinent family history.  Social History   Socioeconomic History  . Marital status: Single    Spouse name: Not on file  . Number of children: Not on file  . Years of education: Not on file  . Highest education level: Not on file  Occupational History  . Not on file  Social Needs  . Financial resource strain: Not on file  . Food insecurity:    Worry: Not on file    Inability: Not on file  . Transportation needs:    Medical: Not on file    Non-medical: Not on file  Tobacco Use  . Smoking status: Never Smoker  Substance and Sexual Activity  . Alcohol use: No  . Drug use: No  . Sexual activity: Yes  Lifestyle  . Physical activity:    Days per week: Not on file    Minutes per session: Not on file  . Stress: Not on file  Relationships  . Social connections:    Talks on phone: Not on file    Gets together: Not on file    Attends religious service: Not on file    Active member of club or organization: Not on file    Attends meetings of clubs or organizations: Not on file  Relationship status: Not on file  Other Topics Concern  . Not on file  Social History Narrative  . Not on file    Review of Systems: A 12 point ROS discussed and pertinent positives are indicated in the HPI above.  All other systems are negative.  Review of Systems  Constitutional: Positive for activity change, appetite change, fatigue and fever.  Respiratory: Negative for cough and shortness of breath.   Cardiovascular: Negative for chest pain.  Gastrointestinal: Positive for abdominal pain.  Skin: Negative for color change and pallor.  Neurological:  Positive for weakness.  Psychiatric/Behavioral: Positive for decreased concentration. Negative for behavioral problems and confusion.    Vital Signs: BP 115/71 (BP Location: Right Arm)   Pulse (!) 119   Temp 99 F (37.2 C)   Resp (!) 24   LMP 04/24/2018   SpO2 96%   Physical Exam  Constitutional: She is oriented to person, place, and time.  Cardiovascular: Normal rate and regular rhythm.  Pulmonary/Chest: Effort normal and breath sounds normal.  Abdominal: Normal appearance and bowel sounds are normal. There is tenderness in the right upper quadrant.  Neurological: She is alert and oriented to person, place, and time.  Skin: Skin is warm and dry.  Psychiatric: She has a normal mood and affect. Her behavior is normal.  Mother at bedside  Nursing note and vitals reviewed.   Imaging: Mr Abdomen W Wo Contrast  Result Date: 04/25/2018 CLINICAL DATA:  Liver lesion on CT, nontraumatic hemoperitoneum EXAM: MRI ABDOMEN WITHOUT AND WITH CONTRAST TECHNIQUE: Multiplanar multisequence MR imaging of the abdomen was performed both before and after the administration of intravenous contrast. CONTRAST:  106mL MULTIHANCE GADOBENATE DIMEGLUMINE 529 MG/ML IV SOLN COMPARISON:  CT abdomen/pelvis dated 04/24/2018 FINDINGS: Motion degraded images. Lower chest: Mild patchy bilateral lower lobe opacities, left greater than right, likely reflecting atelectasis. Trace bilateral pleural effusions. Hepatobiliary: 9.1 x 7.8 x 5.7 cm mass with peripheral enhancement and intralesional hemorrhage centered in segment 6. Associated perihepatic hemorrhage extending posteriorly outside the liver capsule into the hepatorenal fossa. The lesion itself cannot be easily characterized on MR given acute hemorrhage/rupture. Overall clinical picture favors rupture of a large hepatic adenoma, although cavernous hemangioma or FNH are also possibilities. Additional 3.7 x 2.4 cm lesion in segment 6 (series 8001/image 21), conspicuous on T2,  with avid solid enhancement, which is overall favored to reflect [place]. Dominant 2.9 cm enhancing lesion in segment 4B (series 15001/image 15). Additional scattered 12-13 mm lesions in the right liver (series 15001/images 10 and 16) and in the left hepatic lobe (series 15001/images 28 and 36), not conspicuous on T2 but enhancing, favoring small hepatic adenomas. Gallbladder is within normal limits. No intrahepatic or extrahepatic ductal dilatation. Pancreas:  Within normal limits. Spleen:  Within normal limits. Adrenals/Urinary Tract:  Adrenal glands are within normal limits. Kidneys within normal limits.  No hydronephrosis. Stomach/Bowel: Stomach is within normal limits. Visualized bowel is unremarkable. Vascular/Lymphatic:  No evidence of abdominal aortic aneurysm. No suspicious abdominal lymphadenopathy. Other: Hemorrhage along the hepatorenal fossa (series 5001/image 24), likely related to rupture of the dominant liver lesion. Associated mild ascites in the right upper abdomen. Musculoskeletal: No focal osseous lesions. IMPRESSION: Dominant 9.1 cm mass centered in segment 6 with intralesional hemorrhage/rupture, incompletely characterized in the acute setting, although favoring rupture of a large hepatic adenoma (given the clinical setting) over cavernous hemangioma or FNH. Associated fluid/hemorrhage in the right upper abdomen and along the hepatorenal fossa. Additional 3.7 cm lesion in segment 6  which favors a benign FNH. Additional scattered enhancing lesions measuring up to 2.9 cm in segment 4B, favoring hepatic adenomas, benign. Electronically Signed   By: Julian Hy M.D.   On: 04/25/2018 07:17   US Abdomen Complete  Result Date: 04/24/2018 CLINICAL DATA:  27 year old female with abdominal pain, nausea vomiting. EXAM: ABDOMEN ULTRASOUND COMPLETE COMPARISON:  Abdominal radiograph dated 04/24/2018 FINDINGS: Evaluation is limited due to patient's body habitus. Gallbladder: No gallstones or wall  thickening visualized. No sonographic Murphy sign noted by sonographer. Probable trace free fluid adjacent to the gallbladder. Common bile duct: Diameter: 3 mm Liver: No focal lesion identified. Within normal limits in parenchymal echogenicity. Portal vein is patent on color Doppler imaging with normal direction of blood flow towards the liver. IVC: No abnormality visualized. Pancreas: Poorly visualized. Spleen: Size and appearance within normal limits. Right Kidney: Length: 11.5 cm. The kidneys poorly visualized. No hydronephrosis. Left Kidney: Length: 10.6 cm.  The left kidney is poorly visualized. Abdominal aorta: No aneurysm visualized. Other findings: Small perihepatic free fluid. IMPRESSION: Small perihepatic free fluid. No other abnormality noted within the intention of the exam. Electronically Signed   By: Anner Crete M.D.   On: 04/24/2018 06:35   Ct Abdomen Pelvis W Contrast  Result Date: 04/24/2018 CLINICAL DATA:  Acute right upper quadrant abdominal pain. EXAM: CT ABDOMEN AND PELVIS WITH CONTRAST TECHNIQUE: Multidetector CT imaging of the abdomen and pelvis was performed using the standard protocol following bolus administration of intravenous contrast. CONTRAST:  174mL ISOVUE-300 IOPAMIDOL (ISOVUE-300) INJECTION 61% COMPARISON:  Abdominal sonogram from earlier today. FINDINGS: Lower chest: No significant pulmonary nodules or acute consolidative airspace disease. Hepatobiliary: There is an 8.5 x 6.1 cm hypodense posterior right liver lobe mass (series 2/image 27) with irregular outer contour, which is directly contiguous with small to moderate volume posterior right perihepatic acute hyperdense hemoperitoneum. No additional liver masses. Normal gallbladder with no radiopaque cholelithiasis. No biliary ductal dilatation. Pancreas: Normal, with no mass or duct dilation. Spleen: Normal size. No mass. Adrenals/Urinary Tract: Normal adrenals. Normal kidneys with no hydronephrosis and no renal mass.  Normal bladder. Stomach/Bowel: Normal non-distended stomach. Normal caliber small bowel with no small bowel wall thickening. Normal appendix. Normal large bowel with no diverticulosis, large bowel wall thickening or pericolonic fat stranding. Vascular/Lymphatic: Normal caliber abdominal aorta. Patent portal, splenic, hepatic and renal veins. No pathologically enlarged lymph nodes in the abdomen or pelvis. Reproductive: Grossly normal uterus.  No adnexal mass. Other: No pneumoperitoneum. Small to moderate volume hemoperitoneum in the right paracolic gutter and pelvic cul-de-sac. Musculoskeletal: No aggressive appearing focal osseous lesions. No evidence of a fracture. IMPRESSION: 1. Large 8.5 cm hypodense indeterminate posterior right liver lobe mass, which is directly contiguous with small to moderate volume posterior right perihepatic acute hemoperitoneum. Differential includes acute hemorrhagic rupture of an underlying liver mass (such as an hepatic adenoma in a young female) into the perihepatic space versus traumatic liver laceration. Short-term follow-up MRI abdomen without and with IV contrast recommended for further characterization. 2. Small to moderate volume hemoperitoneum in the right paracolic gutter and pelvic cul-de-sac. These results were called by telephone at the time of interpretation on 04/24/2018 at 8:05 am to [facility], who verbally acknowledged these results. Electronically Signed   By: Ilona Sorrel M.D.   On: 04/24/2018 08:07   Dg Abd 2 Views  Result Date: 04/24/2018 CLINICAL DATA:  27 y/o  F; mid abdominal pain, nausea, vomiting. EXAM: ABDOMEN - 2 VIEW COMPARISON:  None. FINDINGS: Nonobstructive bowel  gas pattern. Large volume of stool in the colon may represent constipation. Bones are unremarkable. No pneumoperitoneum. IMPRESSION: Nonobstructive bowel gas pattern. Large volume of stool in the colon may represent constipation. Electronically Signed   By: Kristine Garbe  M.D.   On: 04/24/2018 05:42    Labs:  CBC: Recent Labs    04/24/18 1651 04/25/18 0956 04/25/18 1505 04/26/18 0435  WBC 10.3 11.3* 10.6* 9.9  HGB 8.8* 8.7* 8.4* 8.0*  HCT 27.6* 27.0* 26.2* 24.4*  PLT 198 190 201 182    COAGS: Recent Labs    04/24/18 0856  INR 1.07  APTT 24    BMP: Recent Labs    04/24/18 0231 04/25/18 0956  NA 142 138  K 4.1 3.6  CL 107 104  CO2 24 26  GLUCOSE 154* 125*  BUN 15 6  CALCIUM 9.5 8.5*  CREATININE 0.81 0.78  GFRNONAA >60 >60  GFRAA >60 >60    LIVER FUNCTION TESTS: Recent Labs    04/24/18 0231 04/25/18 0956  BILITOT 0.5 0.4  AST 67* 273*  ALT 65* 420*  ALKPHOS 56 63  PROT 8.0 6.3*  ALBUMIN 4.4 3.6    TUMOR MARKERS: No results for input(s): AFPTM, CEA, CA199, CHROMGRNA in the last 8760 hours.  Assessment and Plan:  Hemoperitoneum Hepatic adenoma- hemorrhage Hg 8 BP stable Plan for hepatic adenoma embolization in IR today or tomorrow Discussed with pt and mother and family Pt and family are aware of procedure: Hepatic arteriogram with possible adenoma embolization. Risks and befits discussed including biut not limited to: infection; bleeding; organ damage; death. IR is aware of not blood products allowed per Religious beliefs. All are agreeable to proceed Consent signed andin chart  Thank you for this interesting consult.  I greatly enjoyed meeting Latoya Kramer and look forward to participating in their care.  A copy of this report was sent to the requesting provider on this date.  Electronically Signed: Lavonia Drafts, PA-C 04/26/2018, 11:23 AM   I spent a total of 55 Miinutes    in face to face in clinical consultation, greater than 50% of which was counseling/coordinating care for hepatic adenoma embolization

## 2018-04-26 NOTE — Progress Notes (Signed)
TYLENO; GIVEN FOR  FOR FEVER WILL CONTINUE TO MONITOR

## 2018-04-27 ENCOUNTER — Inpatient Hospital Stay (HOSPITAL_COMMUNITY): Payer: BC Managed Care – PPO

## 2018-04-27 DIAGNOSIS — R509 Fever, unspecified: Secondary | ICD-10-CM

## 2018-04-27 HISTORY — PX: IR US GUIDE VASC ACCESS RIGHT: IMG2390

## 2018-04-27 HISTORY — PX: IR EMBO ART  VEN HEMORR LYMPH EXTRAV  INC GUIDE ROADMAPPING: IMG5450

## 2018-04-27 HISTORY — PX: IR ANGIOGRAM SELECTIVE EACH ADDITIONAL VESSEL: IMG667

## 2018-04-27 HISTORY — PX: IR ANGIOGRAM VISCERAL SELECTIVE: IMG657

## 2018-04-27 LAB — CBC
HEMATOCRIT: 24.9 % — AB (ref 36.0–46.0)
Hemoglobin: 8.1 g/dL — ABNORMAL LOW (ref 12.0–15.0)
MCH: 29.7 pg (ref 26.0–34.0)
MCHC: 32.5 g/dL (ref 30.0–36.0)
MCV: 91.2 fL (ref 78.0–100.0)
Platelets: 217 10*3/uL (ref 150–400)
RBC: 2.73 MIL/uL — ABNORMAL LOW (ref 3.87–5.11)
RDW: 14.3 % (ref 11.5–15.5)
WBC: 10.3 10*3/uL (ref 4.0–10.5)

## 2018-04-27 LAB — GLUCOSE, CAPILLARY
Glucose-Capillary: 124 mg/dL — ABNORMAL HIGH (ref 65–99)
Glucose-Capillary: 84 mg/dL (ref 65–99)

## 2018-04-27 MED ORDER — IOPAMIDOL (ISOVUE-300) INJECTION 61%
INTRAVENOUS | Status: AC
  Administered 2018-04-27: 50 mL
  Filled 2018-04-27: qty 100

## 2018-04-27 MED ORDER — IOHEXOL 300 MG/ML  SOLN: 120.0000 mL | Freq: Once | INTRAMUSCULAR | Status: DC | PRN

## 2018-04-27 MED ORDER — FENTANYL CITRATE (PF) 100 MCG/2ML IJ SOLN
INTRAMUSCULAR | Status: AC
  Filled 2018-04-27: qty 4

## 2018-04-27 MED ORDER — GELATIN ABSORBABLE 12-7 MM EX MISC
CUTANEOUS | Status: AC
  Administered 2018-04-27: 18:00:00
  Filled 2018-04-27: qty 1

## 2018-04-27 MED ORDER — MIDAZOLAM HCL 2 MG/2ML IJ SOLN
INTRAMUSCULAR | Status: AC
  Filled 2018-04-27: qty 4

## 2018-04-27 MED ORDER — FENTANYL CITRATE (PF) 100 MCG/2ML IJ SOLN
INTRAMUSCULAR | Status: AC | PRN
  Administered 2018-04-27 (×2): 25 ug via INTRAVENOUS
  Administered 2018-04-27: 50 ug via INTRAVENOUS

## 2018-04-27 MED ORDER — METOPROLOL TARTRATE 25 MG PO TABS
25.0000 mg | ORAL_TABLET | Freq: Two times a day (BID) | ORAL | Status: DC
  Administered 2018-04-27 – 2018-04-29 (×4): 25 mg via ORAL
  Filled 2018-04-27 (×4): qty 1

## 2018-04-27 MED ORDER — SODIUM CHLORIDE 0.9 % IV SOLN
500.0000 mg | INTRAVENOUS | Status: DC
  Administered 2018-04-27 – 2018-04-30 (×4): 500 mg via INTRAVENOUS
  Filled 2018-04-27 (×5): qty 500

## 2018-04-27 MED ORDER — MIDAZOLAM HCL 2 MG/2ML IJ SOLN
INTRAMUSCULAR | Status: AC | PRN
  Administered 2018-04-27: 1 mg via INTRAVENOUS
  Administered 2018-04-27: 0.5 mg via INTRAVENOUS
  Administered 2018-04-27: 1 mg via INTRAVENOUS
  Administered 2018-04-27: 0.5 mg via INTRAVENOUS

## 2018-04-27 MED ORDER — LIDOCAINE HCL 1 % IJ SOLN
INTRAMUSCULAR | Status: AC
  Filled 2018-04-27: qty 20

## 2018-04-27 MED ORDER — LIDOCAINE HCL (PF) 1 % IJ SOLN
INTRAMUSCULAR | Status: AC | PRN
  Administered 2018-04-27: 8 mL

## 2018-04-27 MED ORDER — CEFTRIAXONE SODIUM 1 G IJ SOLR
1.0000 g | INTRAMUSCULAR | Status: DC
  Administered 2018-04-27 – 2018-04-30 (×3): 1 g via INTRAVENOUS
  Filled 2018-04-27 (×6): qty 10

## 2018-04-27 NOTE — Progress Notes (Signed)
Patient ID: Latoya Kramer, female   DOB: [birth year], 27 y.o.   MRN: [id number]   Acute Care Surgery Service Progress Note:    Chief Complaint/Subjective: No c/o.  Mom at [facility] Low grade temp  Objective: Vital signs in last 24 hours: Temp:  [98 F (36.7 C)-100.6 F (38.1 C)] 99.3 F (37.4 C) (05/08 0825) Pulse Rate:  [103-134] 110 (05/08 0211) Resp:  [22-46] 31 (05/08 0211) BP: (106-148)/(73-93) 126/83 (05/08 0211) SpO2:  [90 %-100 %] 90 % (05/08 0211) Last BM Date: 04/23/18  Intake/Output from previous day: No intake/output data recorded. Intake/Output this shift: No intake/output data recorded.  Lungs: cta, nonlabored  Cardiovascular: reg  Abd: soft, obese, NT  Extremities: no edema, +SCDs  Neuro: alert, nonfocal  Lab Results: CBC  Recent Labs    04/26/18 0435 04/27/18 0443  WBC 9.9 10.3  HGB 8.0* 8.1*  HCT 24.4* 24.9*  PLT 182 217   BMET Recent Labs    04/25/18 0956  NA 138  K 3.6  CL 104  CO2 26  GLUCOSE 125*  BUN 6  CREATININE 0.78  CALCIUM 8.5*   LFT Hepatic Function Latest Ref Rng & Units 04/25/2018 04/24/2018  Total Protein 6.5 - 8.1 g/dL 6.3(L) 8.0  Albumin 3.5 - 5.0 g/dL 3.6 4.4  AST 15 - 41 U/L 273(H) 67(H)  ALT 14 - 54 U/L 420(H) 65(H)  Alk Phosphatase 38 - 126 U/L 63 56  Total Bilirubin 0.3 - 1.2 mg/dL 0.4 0.5   PT/INR Recent Labs    04/24/18 0856  LABPROT 13.8  INR 1.07   ABG No results for input(s): PHART, HCO3 in the last 72 hours.  Invalid input(s): PCO2, PO2  Studies/Results:  Anti-infectives: Anti-infectives (From admission, onward)   None      Medications: Scheduled Meds: . cyanocobalamin  1,000 mcg Subcutaneous Daily  . Darbepoetin Alfa  200 mcg Subcutaneous Q7 days  . folic acid  1 mg Oral Daily  . metoprolol tartrate  12.5 mg Oral BID  . senna-docusate  2 tablet Oral QHS   Continuous Infusions: . dextrose 5 % and 0.9 % NaCl with KCl 20 mEq/L 85 mL/hr at 04/26/18 1833   PRN Meds:.acetaminophen,  ALPRAZolam, HYDROmorphone (DILAUDID) injection, ondansetron **OR** ondansetron (ZOFRAN) IV  Assessment/Plan: Patient Active Problem List   Diagnosis Date Noted  . Hemoperitoneum, nontraumatic 04/24/2018  . Tachycardia 04/24/2018  . Hyperglycemia 04/24/2018  . Leukocytosis 04/24/2018  . Hypertension 04/24/2018  . Obesity 04/24/2018  . Anemia   . Liver mass    9 x8 x 6 Large right hepatic mass with hemorrhage - prob adenoma Rt hepatic FNH mass - seg 6 Multiple other hepatic adenomas OCP use Acute blood loss anemia Hemoperitoneum Jehovah witness  hgb essentially stable at around 8 (she is on 125cc/hr mivf) rec Transarterial embolization given large hepatic adenoma, low hgb and pt is Jehovah witness and will not accept prbc/ffp, etc.   I think TAE will help decrease risk of further bleeding of hepatic adenoma and may perhaps shrink adenoma.  Then pt can hopefully get 2nd opinon at [facility] as outpatient  Consider starting Epo/ other measures to boost blood count Can have clears after IR procedure   Disposition:  LOS: 3 days    Leighton Ruff. Redmond Pulling, MD, FACS General, Bariatric, & Minimally Invasive Surgery [phone] [facility]

## 2018-04-27 NOTE — Progress Notes (Signed)
Pharmacy Antibiotic Note  Latoya Kramer is a 27 y.o. female admitted on 04/24/2018 with abdominal pain with CT showing liver mass.  Pharmacy has been consulted for ceftriaxone dosing for CAP; also on azithromycin. Ceftriaxone is not renally adjusted.  Plan: Ceftriaxone 1 g IV q24h Recommend 7 days of antibiotic therapy Pharmacy signing off - please re consult if needed     Temp (24hrs), Avg:99.4 F (37.4 C), Min:98.3 F (36.8 C), Max:100.6 F (38.1 C)  Recent Labs  Lab 04/24/18 0231  04/24/18 1651 04/25/18 0956 04/25/18 1505 04/26/18 0435 04/27/18 0443  WBC 16.8*   < > 10.3 11.3* 10.6* 9.9 10.3  CREATININE 0.81  --   --  0.78  --   --   --   LATICACIDVEN  --   --  1.7  --   --   --   --    < > = values in this interval not displayed.    CrCl cannot be calculated (Unknown ideal weight.).    Allergies  Allergen Reactions  . Sulfa Antibiotics Other (See Comments)    unknown     Thank you for allowing pharmacy to be a part of this patient's care.  Renold Genta, PharmD, BCPS Clinical Pharmacist Clinical phone for 04/27/2018 until 10p is [phone] After 10p, please call Main Rx at [phone] for assistance 04/27/2018 5:02 PM

## 2018-04-27 NOTE — Progress Notes (Addendum)
[facility] PROGRESS NOTE  Latoya Kramer YQI:[id number] DOB: 18-Jul-1991 DOA: 04/24/2018 PCP: Patient, No Pcp Per  Brief summary   27yo F Jehovah's Witness w/ a hx of HTN and obesity who presented to the ED w/ abdominal pain. CT abdom noted a posterior right liver lobe mass with moderate volume perihepatic acute hemoperitoneum.     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 General surgery following and understandably reluctant to consider surgical interaction given the patient's refusal of blood products -IR to proceed w/ embolization 5/8 -continue to follow hemoglobin in the serial fashion  Acute blood loss anemia. Due to intra-abdominal bleeding as discussed above - the patient will not accept blood transfusions -attempt to minimize phlebotomy but at the same time must follow hemoglobin in serial fashion at this time - supplement L87, folic acid, and dose w/ IV Fe. epo   HTN blood pressure stable  Morbid obesity   Fever on 5/7. Unclear etiology. No leukocytosis. She reports recent bronchitis, cough. Will check CXR. Check UA.   Addendum: CXR: possible pneumonia. Updated, will start iv antibiotics. Monitor   Code Status: full Family Communication: d/w patient, her family, RN (indicate person spoken with, relationship, and if by phone, the number) Disposition Plan: remains inpatient    Consultants:  Surgery   IR  Procedures:  Pend IR  Antibiotics: Anti-infectives (From admission, onward)   None        (indicate start date, and stop date if known)  HPI/Subjective: Reports some cough. Febrile yesterday. No acute changes in her abdominal pains.   Objective: Vitals:   04/27/18 1024 04/27/18 1225  BP: 118/72 (!) 133/99  Pulse: (!) 122 (!) 110  Resp:  (!) 24  Temp:  98.3 F (36.8 C)  SpO2:  99%   No intake or output data in the 24 hours ending 04/27/18 1307 There were no vitals filed for this  visit.  Exam:   General:  No distress   Cardiovascular: s1.s2 rrr  Respiratory: no wheezing   Abdomen: soft, nt   Musculoskeletal: no leg edema    Data Reviewed: Basic Metabolic Panel: Recent Labs  Lab 04/24/18 0231 04/25/18 0956  NA 142 138  K 4.1 3.6  CL 107 104  CO2 24 26  GLUCOSE 154* 125*  BUN 15 6  CREATININE 0.81 0.78  CALCIUM 9.5 8.5*   Liver Function Tests: Recent Labs  Lab 04/24/18 0231 04/25/18 0956  AST 67* 273*  ALT 65* 420*  ALKPHOS 56 63  BILITOT 0.5 0.4  PROT 8.0 6.3*  ALBUMIN 4.4 3.6   Recent Labs  Lab 04/24/18 0231  LIPASE 35   No results for input(s): AMMONIA in the last 168 hours. CBC: Recent Labs  Lab 04/24/18 0856 04/24/18 1651 04/25/18 0956 04/25/18 1505 04/26/18 0435 04/27/18 0443  WBC 11.3* 10.3 11.3* 10.6* 9.9 10.3  NEUTROABS 9.0*  --   --   --   --   --   HGB 9.4* 8.8* 8.7* 8.4* 8.0* 8.1*  HCT 29.3* 27.6* 27.0* 26.2* 24.4* 24.9*  MCV 90.7 91.4 90.6 91.0 90.7 91.2  PLT 219 198 190 201 182 217   Cardiac Enzymes: No results for input(s): CKTOTAL, CKMB, CKMBINDEX, TROPONINI in the last 168 hours. BNP (last 3 results) No results for input(s): BNP in the last 8760 hours.  ProBNP (last 3 results) No results for input(s): PROBNP in the last 8760 hours.  CBG: Recent Labs  Lab 04/26/18 0817 04/26/18 1239 04/26/18  1717 04/26/18 2130 04/27/18 0825  GLUCAP 126* 125* 113* 99 124*    Recent Results (from the past 240 hour(s))  Culture, blood (routine x 2)     Status: None (Preliminary result)   Collection Time: 04/25/18  5:45 PM  Result Value Ref Range Status   Specimen Description BLOOD RIGHT ANTECUBITAL  Final   Special Requests   Final    BOTTLES DRAWN AEROBIC AND ANAEROBIC Blood Culture adequate volume   Culture   Final    NO GROWTH 2 DAYS Performed at [facility], [place] [address] [place], [place] [address]    Report Status PENDING  Incomplete  Culture, blood (routine x 2)     Status: None  (Preliminary result)   Collection Time: 04/25/18  5:50 PM  Result Value Ref Range Status   Specimen Description BLOOD RIGHT ANTECUBITAL  Final   Special Requests   Final    BOTTLES DRAWN AEROBIC AND ANAEROBIC Blood Culture adequate volume   Culture   Final    NO GROWTH 2 DAYS Performed at [facility], [place] [address], [place], [place] [address]    Report Status PENDING  Incomplete     Studies: No results found.  Scheduled Meds: . cyanocobalamin  1,000 mcg Subcutaneous Daily  . Darbepoetin Alfa  200 mcg Subcutaneous Q7 days  . folic acid  1 mg Oral Daily  . metoprolol tartrate  12.5 mg Oral BID  . senna-docusate  2 tablet Oral QHS   Continuous Infusions: . dextrose 5 % and 0.9 % NaCl with KCl 20 mEq/L 85 mL/hr at 04/27/18 3329    Principal Problem:   Hemoperitoneum, nontraumatic Active Problems:   Anemia   Liver mass   Tachycardia   Hyperglycemia   Leukocytosis   Hypertension   Obesity    Time spent: >35 minutes    Kinnie Feil  [facility] Pager [phone]. If 7PM-7AM, please contact night-coverage at [email or web address], password [facility] 04/27/2018, 1:07 PM  LOS: 3 days

## 2018-04-27 NOTE — Sedation Documentation (Signed)
Closure device used. Manual pressure being held

## 2018-04-27 NOTE — Procedures (Signed)
R lobe adenoma embolization Int - 500-700 embospheres and gelfoam EBL 0 Comp 0

## 2018-04-28 ENCOUNTER — Encounter (HOSPITAL_COMMUNITY): Payer: Self-pay | Admitting: Interventional Radiology

## 2018-04-28 LAB — URINALYSIS, ROUTINE W REFLEX MICROSCOPIC
BILIRUBIN URINE: NEGATIVE
GLUCOSE, UA: NEGATIVE mg/dL
Hgb urine dipstick: NEGATIVE
Ketones, ur: 5 mg/dL — AB
Leukocytes, UA: NEGATIVE
NITRITE: NEGATIVE
PH: 6 (ref 5.0–8.0)
Protein, ur: NEGATIVE mg/dL
Specific Gravity, Urine: 1.02 (ref 1.005–1.030)

## 2018-04-28 LAB — CBC
HCT: 26.1 % — ABNORMAL LOW (ref 36.0–46.0)
HEMOGLOBIN: 8.2 g/dL — AB (ref 12.0–15.0)
MCH: 28.3 pg (ref 26.0–34.0)
MCHC: 31.4 g/dL (ref 30.0–36.0)
MCV: 90 fL (ref 78.0–100.0)
PLATELETS: 289 10*3/uL (ref 150–400)
RBC: 2.9 MIL/uL — AB (ref 3.87–5.11)
RDW: 13.9 % (ref 11.5–15.5)
WBC: 12.2 10*3/uL — ABNORMAL HIGH (ref 4.0–10.5)

## 2018-04-28 NOTE — Plan of Care (Signed)
  Problem: Education: Goal: Knowledge of General Education information will improve Outcome: Progressing   Problem: Health Behavior/Discharge Planning: Goal: Ability to manage health-related needs will improve Outcome: Progressing   Problem: Clinical Measurements: Goal: Ability to maintain clinical measurements within normal limits will improve Outcome: Progressing Goal: Will remain free from infection Outcome: Progressing Goal: Diagnostic test results will improve Outcome: Progressing Goal: Respiratory complications will improve Outcome: Progressing Goal: Cardiovascular complication will be avoided Outcome: Progressing   Problem: Activity: Goal: Risk for activity intolerance will decrease Outcome: Progressing   Problem: Coping: Goal: Level of anxiety will decrease Outcome: Progressing   Problem: Elimination: Goal: Will not experience complications related to bowel motility Outcome: Progressing Goal: Will not experience complications related to urinary retention Outcome: Progressing   Problem: Pain Managment: Goal: General experience of comfort will improve Outcome: Progressing   Problem: Safety: Goal: Ability to remain free from injury will improve Outcome: Progressing   Problem: Skin Integrity: Goal: Risk for impaired skin integrity will decrease Outcome: Progressing   

## 2018-04-28 NOTE — Progress Notes (Addendum)
[facility] Progress Note     Subjective: CC- right sided abdominal pain Having some right sided abdominal soreness, but overall doing ok. Pain is not getting worse. Denies n/v. Passing flatus. She does not have much of an appetite, but she tolerated a regular diet for breakfast. Mild leukocytosis today WBC 12.2, TMAX 100.4.  Objective: Vital signs in last 24 hours: Temp:  [98.3 F (36.8 C)-100.4 F (38 C)] 98.4 F (36.9 C) (05/09 0812) Pulse Rate:  [107-122] 107 (05/09 0812) Resp:  [17-46] 24 (05/09 0812) BP: (118-164)/(72-112) 142/95 (05/09 0812) SpO2:  [96 %-100 %] 96 % (05/09 0812) Last BM Date: 04/23/18  Intake/Output from previous day: 05/08 0701 - 05/09 0700 In: 1731.9 [I.V.:1481.9; IV Piggyback:250] Out: 1700 [Urine:1700] Intake/Output this shift: Total I/O In: -  Out: 400 [Urine:400]  PE: Gen:  Alert, NAD HEENT: EOM's intact, pupils equal and round Card:  tachy Pulm:  CTAB, no W/R/R, effort normal Abd: obese, soft, ND, mild right sided abdominal tenderness without rebound or guarding, +BS, no HSM, no hernia Ext:  Calves soft and nontender Psych: A&Ox3  Skin: no rashes noted, warm and dry  Lab Results:  Recent Labs    04/27/18 0443 04/28/18 0403  WBC 10.3 12.2*  HGB 8.1* 8.2*  HCT 24.9* 26.1*  PLT 217 289   BMET Recent Labs    04/25/18 0956  NA 138  K 3.6  CL 104  CO2 26  GLUCOSE 125*  BUN 6  CREATININE 0.78  CALCIUM 8.5*   PT/INR No results for input(s): LABPROT, INR in the last 72 hours. CMP     Component Value Date/Time   NA 138 04/25/2018 0956   K 3.6 04/25/2018 0956   CL 104 04/25/2018 0956   CO2 26 04/25/2018 0956   GLUCOSE 125 (H) 04/25/2018 0956   BUN 6 04/25/2018 0956   CREATININE 0.78 04/25/2018 0956   CALCIUM 8.5 (L) 04/25/2018 0956   PROT 6.3 (L) 04/25/2018 0956   ALBUMIN 3.6 04/25/2018 0956   AST 273 (H) 04/25/2018 0956   ALT 420 (H) 04/25/2018 0956   ALKPHOS 63 04/25/2018 0956   BILITOT 0.4  04/25/2018 0956   GFRNONAA >60 04/25/2018 0956   GFRAA >60 04/25/2018 0956   Lipase     Component Value Date/Time   LIPASE 35 04/24/2018 0231       Studies/Results: Dg Chest 2 View  Result Date: 04/27/2018 CLINICAL DATA:  Fever. EXAM: CHEST - 2 VIEW COMPARISON:  None. FINDINGS: Mild cardiomegaly is noted. No pneumothorax is noted. Moderate left basilar opacity is noted concerning for pneumonia or atelectasis with associated pleural effusion. Mild right basilar atelectasis is noted. Bony thorax is unremarkable. IMPRESSION: Moderate left basilar opacity is noted concerning for pneumonia or atelectasis with associated pleural effusion. Mild right basilar atelectasis is noted. Electronically Signed   By: Marijo Conception, M.D.   On: 04/27/2018 14:26    Anti-infectives: Anti-infectives (From admission, onward)   Start     Dose/Rate Route Frequency Ordered Stop   04/27/18 1800  azithromycin (ZITHROMAX) 500 mg in sodium chloride 0.9 % 250 mL IVPB     500 mg 250 mL/hr over 60 Minutes Intravenous Every 24 hours 04/27/18 1647     04/27/18 1800  cefTRIAXone (ROCEPHIN) 1 g in sodium chloride 0.9 % 100 mL IVPB     1 g 200 mL/hr over 30 Minutes Intravenous Every 24 hours 04/27/18 1657         Assessment/Plan Acute blood loss  anemia - stable Jehovah witness Obese  9 x8 x 6 Large right hepatic mass with hemorrhage - probably adenoma - CT scan 5/5 showed large 8.5 cm hypodense indeterminate posterior right liver lobe mass, which is directly contiguous with small to moderate volume posterior right perihepatic acute hemoperitoneum - S/p R lobe adenoma embolization 5/8 in IR - Hg 8.2 from 8.1, stable  ID - azithromycin 5/8>>, rocephin 5/8>> FEN - regular diet VTE - SCDs Foley - none Follow up - TBD  Plan - S/p TAE yesterday, hemoglobin stable. Continue to monitor CBC daily. Tolerating regular diet. If patient remains stable she will hopefully be able to get 2nd opinon at [facility] as  outpatient.    LOS: 4 days    Latoya Kramer , [facility] 04/28/2018, 8:58 AM Pager: [phone] Consults: [phone] Mon-Fri 7:00 am-4:30 pm Sat-Sun 7:00 am-11:30 am

## 2018-04-28 NOTE — Progress Notes (Signed)
[facility] PROGRESS NOTE  Latoya Kramer RCV:[id number] DOB: 1991-05-14 DOA: 04/24/2018 PCP: Patient, No Pcp Per  Brief summary   27yo F Je[employer]'s Witness w/ a hx of HTN and obesity who presented to the ED w/ abdominal pain. CT abdom noted a posterior right liver lobe mass with moderate volume perihepatic acute hemoperitoneum.     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 General surgery following and understandably reluctant to consider surgical interaction given the patient's refusal of blood products -underwent IR w/ embolization on 5/8. Hg is stable. Monitor   Acute blood loss anemia. Due to intra-abdominal bleeding as discussed above - the patient will not accept blood transfusions -attempt to minimize phlebotomy but at the same time must follow hemoglobin, will recheck AM. - supplement Z02, folic acid, and dose w/ IV Fe. epo   HTN blood pressure stable, on BB.  Morbid obesity   Fever on 5/7, then mild fever on 5/8. Mild leukocytosis. Pt reported recent bronchitis, +cough. CXR showed possible pneumonia. started iv antibiotics. Plan to transition to oral regimen AM. Monitor     Tachycardia. Possible due to intraabdominal bleed, pain. Also pneumonia. BP is stable. No acute    chest [pains. Will monitor, on BB  Code Status: full Family Communication: d/w patient, her family, RN (indicate person spoken with, relationship, and if by phone, the number) Disposition Plan: remains inpatient. possible 24-48 hrs    Consultants:  Surgery   IR  Procedures:  Pend IR  Antibiotics: Anti-infectives (From admission, onward)   Start     Dose/Rate Route Frequency Ordered Stop   04/27/18 1800  azithromycin (ZITHROMAX) 500 mg in sodium chloride 0.9 % 250 mL IVPB     500 mg 250 mL/hr over 60 Minutes Intravenous Every 24 hours 04/27/18 1647     04/27/18 1800  cefTRIAXone (ROCEPHIN) 1 g in sodium chloride 0.9 % 100 mL IVPB     1 g 200  mL/hr over 30 Minutes Intravenous Every 24 hours 04/27/18 1657         (indicate start date, and stop date if known)  HPI/Subjective: Mild  fever last night. + cough.  No acute changes in her abdominal pains.   Objective: Vitals:   04/27/18 2300 04/28/18 0812  BP:  (!) 142/95  Pulse:  (!) 107  Resp:  (!) 24  Temp: 98.8 F (37.1 C) 98.4 F (36.9 C)  SpO2:  96%    Intake/Output Summary (Last 24 hours) at 04/28/2018 1132 Last data filed at 04/28/2018 1015 Gross per 24 hour  Intake 1953.92 ml  Output 2100 ml  Net -146.08 ml   There were no vitals filed for this visit.  Exam:   General:  No distress   Cardiovascular: s1.s2 rrr  Respiratory: no wheezing   Abdomen: soft, nt   Musculoskeletal: no leg edema    Data Reviewed: Basic Metabolic Panel: Recent Labs  Lab 04/24/18 0231 04/25/18 0956  NA 142 138  K 4.1 3.6  CL 107 104  CO2 24 26  GLUCOSE 154* 125*  BUN 15 6  CREATININE 0.81 0.78  CALCIUM 9.5 8.5*   Liver Function Tests: Recent Labs  Lab 04/24/18 0231 04/25/18 0956  AST 67* 273*  ALT 65* 420*  ALKPHOS 56 63  BILITOT 0.5 0.4  PROT 8.0 6.3*  ALBUMIN 4.4 3.6   Recent Labs  Lab 04/24/18 0231  LIPASE 35   No results for input(s): AMMONIA in the last 168 hours.  CBC: Recent Labs  Lab 04/24/18 0856  04/25/18 0956 04/25/18 1505 04/26/18 0435 04/27/18 0443 04/28/18 0403  WBC 11.3*   < > 11.3* 10.6* 9.9 10.3 12.2*  NEUTROABS 9.0*  --   --   --   --   --   --   HGB 9.4*   < > 8.7* 8.4* 8.0* 8.1* 8.2*  HCT 29.3*   < > 27.0* 26.2* 24.4* 24.9* 26.1*  MCV 90.7   < > 90.6 91.0 90.7 91.2 90.0  PLT 219   < > 190 201 182 217 289   < > = values in this interval not displayed.   Cardiac Enzymes: No results for input(s): CKTOTAL, CKMB, CKMBINDEX, TROPONINI in the last 168 hours. BNP (last 3 results) No results for input(s): BNP in the last 8760 hours.  ProBNP (last 3 results) No results for input(s): PROBNP in the last 8760 hours.  CBG: Recent  Labs  Lab 04/26/18 1239 04/26/18 1717 04/26/18 2130 04/27/18 0825 04/27/18 1845  GLUCAP 125* 113* 99 124* 84    Recent Results (from the past 240 hour(s))  Culture, blood (routine x 2)     Status: None (Preliminary result)   Collection Time: 04/25/18  5:45 PM  Result Value Ref Range Status   Specimen Description BLOOD RIGHT ANTECUBITAL  Final   Special Requests   Final    BOTTLES DRAWN AEROBIC AND ANAEROBIC Blood Culture adequate volume   Culture   Final    NO GROWTH 2 DAYS Performed at [facility], [place] [address] [place], [place] [address]    Report Status PENDING  Incomplete  Culture, blood (routine x 2)     Status: None (Preliminary result)   Collection Time: 04/25/18  5:50 PM  Result Value Ref Range Status   Specimen Description BLOOD RIGHT ANTECUBITAL  Final   Special Requests   Final    BOTTLES DRAWN AEROBIC AND ANAEROBIC Blood Culture adequate volume   Culture   Final    NO GROWTH 2 DAYS Performed at [facility], [place] [address] [place], [place] [address]    Report Status PENDING  Incomplete     Studies: Dg Chest 2 View  Result Date: 04/27/2018 CLINICAL DATA:  Fever. EXAM: CHEST - 2 VIEW COMPARISON:  None. FINDINGS: Mild cardiomegaly is noted. No pneumothorax is noted. Moderate left basilar opacity is noted concerning for pneumonia or atelectasis with associated pleural effusion. Mild right basilar atelectasis is noted. Bony thorax is unremarkable. IMPRESSION: Moderate left basilar opacity is noted concerning for pneumonia or atelectasis with associated pleural effusion. Mild right basilar atelectasis is noted. Electronically Signed   By: Marijo Conception, M.D.   On: 04/27/2018 14:26   Ir Angiogram Visceral Selective  Result Date: 04/28/2018 INDICATION: Intraperitoneal hemorrhage from hepatic adenoma EXAM: HEPATIC ARTERY EMBOLIZATION, VISCERAL ANGIOGRAPHY, ARTERIOGRAPHY THROUGH EXISTING VESSEL, ULTRASOUND GUIDANCE MEDICATIONS: None ANESTHESIA/SEDATION:  Moderate (conscious) sedation was employed during this procedure. A total of Versed 3 mg and Fentanyl 100 mcg was administered intravenously. Moderate Sedation Time: 70 minutes. The patient's level of consciousness and vital signs were monitored continuously by radiology nursing throughout the procedure under my direct supervision. CONTRAST:  150 cc Isovue-300 FLUOROSCOPY TIME:  Fluoroscopy Time: 20 minutes 12 seconds (1,190 mGy). COMPLICATIONS: None immediate. PROCEDURE: Informed consent was obtained from the patient following explanation of the procedure, risks, benefits and alternatives. The patient understands, agrees and consents for the procedure. All questions were addressed. A time out was performed prior  to the initiation of the procedure. Maximal barrier sterile technique utilized including caps, mask, sterile gowns, sterile gloves, large sterile drape, hand hygiene, and Betadine prep. The right groin was prepped and draped in a sterile fashion. 1% lidocaine was utilized for local anesthesia. Under sonographic guidance, a micropuncture needle was inserted into the right common femoral artery. Ultrasound documentation was obtained. The vessel was noted to be patent. This was up sized to a Bentson. A 5 French sheath was inserted. A cobra 2 catheter was advanced into the aorta. The Cobra was advanced over the Bentson wire into the celiac axis. Angiography was performed. The catheter was then advanced into the splenic artery. Arteriography was performed. Delayed images demonstrate patency of the portal vein. The catheter was retracted then advanced over a glidewire into the common hepatic artery. Angiography was performed. It was then advanced into the right hepatic artery. Subsequent arteriography was performed. A Renegade high flow catheter was then advanced through the Cobra 2 catheter and into the right hepatic arterial circulation. Four branches of the right hepatic artery were selected and embolized with  a combination of 500-700 micron embospheres and Gel-Foam slurry. The microcatheter was removed and final imaging through the Cobra 2 catheter was performed. The Cobra catheter was retracted to the right external iliac artery and femoral angiography was performed. The catheter was removed with the sheath left in place. An ExoSeal device was deployed for hemostasis. FINDINGS: Celiac arteriography delineates anatomy. Splenic arteriography with delayed images demonstrate patency of the splenic and portal veins. Hepatic arterial injection delineates anatomy throughout the liver. There is a large filling defect within the right lobe. Within the filling defect, there are abnormal vessels and pooling of contrast. These areas are worrisome for pseudoaneurysm formation or possibly active contrast extravasation. It may simply represent foci of tumor blush. These areas correspond to the abnormal mass with hemorrhage on the accompanying MRI of the abdomen. After embolization, the abnormal vasculature was no longer filling. A wedge-shaped defect within the inferior right lobe of the liver was identified. This is consistent with successful embolization of the abnormal right lobe lesion. 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 A combination of embospheres and Gel-Foam slurry was utilized. Electronically Signed   By: Marybelle Killings M.D.   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 A total of Versed 3 mg and Fentanyl 100 mcg was administered intravenously. Moderate Sedation Time: 70 minutes. The patient's level of consciousness and vital signs were  monitored continuously by radiology nursing throughout the procedure under my direct supervision. CONTRAST:  150 cc Isovue-300 FLUOROSCOPY TIME:  Fluoroscopy Time: 20 minutes 12 seconds (1,190 mGy). COMPLICATIONS: None immediate. PROCEDURE: Informed consent was obtained from the patient following explanation of the procedure, risks, benefits and alternatives. The patient understands, agrees and consents for the procedure. All questions were addressed. A time out was performed prior to the initiation of the procedure. Maximal barrier sterile technique utilized including caps, mask, sterile gowns, sterile gloves, large sterile drape, hand hygiene, and Betadine prep. The right groin was prepped and draped in a sterile fashion. 1% lidocaine was utilized for local anesthesia. Under sonographic guidance, a micropuncture needle was inserted into the right common femoral artery. Ultrasound documentation was obtained. The vessel was noted  to be patent. This was up sized to a Bentson. A 5 French sheath was inserted. A cobra 2 catheter was advanced into the aorta. The Cobra was advanced over the Bentson wire into the celiac axis. Angiography was performed. The catheter was then advanced into the splenic artery. Arteriography was performed. Delayed images demonstrate patency of the portal vein. The catheter was retracted then advanced over a glidewire into the common hepatic artery. Angiography was performed. It was then advanced into the right hepatic artery. Subsequent arteriography was performed. A Renegade high flow catheter was then advanced through the Cobra 2 catheter and into the right hepatic arterial circulation. Four branches of the right hepatic artery were selected and embolized with a combination of 500-700 micron embospheres and Gel-Foam slurry. The microcatheter was removed and final imaging through the Cobra 2 catheter was performed. The Cobra catheter was retracted to the right external iliac artery and  femoral angiography was performed. The catheter was removed with the sheath left in place. An ExoSeal device was deployed for hemostasis. FINDINGS: Celiac arteriography delineates anatomy. Splenic arteriography with delayed images demonstrate patency of the splenic and portal veins. Hepatic arterial injection delineates anatomy throughout the liver. There is a large filling defect within the right lobe. Within the filling defect, there are abnormal vessels and pooling of contrast. These areas are worrisome for pseudoaneurysm formation or possibly active contrast extravasation. It may simply represent foci of tumor blush. These areas correspond to the abnormal mass with hemorrhage on the accompanying MRI of the abdomen. After embolization, the abnormal vasculature was no longer filling. A wedge-shaped defect within the inferior right lobe of the liver was identified. This is consistent with successful embolization of the abnormal right lobe lesion. 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 A combination of embospheres and Gel-Foam slurry was utilized. Electronically Signed   By: Marybelle Killings M.D.   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 A total of Versed 3 mg and Fentanyl 100 mcg was administered intravenously. Moderate Sedation Time: 70 minutes. The patient's level of consciousness and vital signs were monitored continuously by radiology nursing throughout the procedure under my direct supervision. CONTRAST:  150 cc Isovue-300 FLUOROSCOPY TIME:  Fluoroscopy Time: 20 minutes 12 seconds (1,190 mGy). COMPLICATIONS: None immediate.  PROCEDURE: Informed consent was obtained from the patient following explanation of the procedure, risks, benefits and alternatives. The patient understands, agrees and consents for the procedure. All questions were addressed. A time out was performed prior to the initiation of the procedure. Maximal barrier sterile technique utilized including caps, mask, sterile gowns, sterile gloves, large sterile drape, hand hygiene, and Betadine prep. The right groin was prepped and draped in a sterile fashion. 1% lidocaine was utilized for local anesthesia. Under sonographic guidance, a micropuncture needle was inserted into the right common femoral artery. Ultrasound documentation was obtained. The vessel was noted to be patent. This was up sized to a Bentson. A 5 French sheath was inserted. A cobra 2 catheter was advanced into the aorta. The Cobra was advanced over the Bentson wire into the celiac axis. Angiography was performed. The catheter was then advanced into the splenic artery. Arteriography was performed. Delayed images demonstrate patency of the portal vein. The catheter was retracted then advanced  over a glidewire into the common hepatic artery. Angiography was performed. It was then advanced into the right hepatic artery. Subsequent arteriography was performed. A Renegade high flow catheter was then advanced through the Cobra 2 catheter and into the right hepatic arterial circulation. Four branches of the right hepatic artery were selected and embolized with a combination of 500-700 micron embospheres and Gel-Foam slurry. The microcatheter was removed and final imaging through the Cobra 2 catheter was performed. The Cobra catheter was retracted to the right external iliac artery and femoral angiography was performed. The catheter was removed with the sheath left in place. An ExoSeal device was deployed for hemostasis. FINDINGS: Celiac arteriography delineates anatomy. Splenic arteriography with delayed images  demonstrate patency of the splenic and portal veins. Hepatic arterial injection delineates anatomy throughout the liver. There is a large filling defect within the right lobe. Within the filling defect, there are abnormal vessels and pooling of contrast. These areas are worrisome for pseudoaneurysm formation or possibly active contrast extravasation. It may simply represent foci of tumor blush. These areas correspond to the abnormal mass with hemorrhage on the accompanying MRI of the abdomen. After embolization, the abnormal vasculature was no longer filling. A wedge-shaped defect within the inferior right lobe of the liver was identified. This is consistent with successful embolization of the abnormal right lobe lesion. 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 A combination of embospheres and Gel-Foam slurry was utilized. Electronically Signed   By: Marybelle Killings M.D.   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 A total of Versed 3 mg and Fentanyl 100 mcg was administered intravenously. Moderate Sedation Time: 70 minutes. The patient's level of consciousness and vital signs were monitored continuously by radiology nursing throughout the procedure under my direct supervision. CONTRAST:  150 cc Isovue-300 FLUOROSCOPY TIME:  Fluoroscopy Time: 20 minutes 12 seconds (1,190 mGy). COMPLICATIONS: None immediate. PROCEDURE: Informed consent was obtained from the patient following explanation of the procedure, risks, benefits and alternatives. The patient understands, agrees and consents for the procedure. All questions were addressed. A time  out was performed prior to the initiation of the procedure. Maximal barrier sterile technique utilized including caps, mask, sterile gowns, sterile gloves, large sterile drape, hand hygiene, and Betadine prep. The right groin was prepped and draped in a sterile fashion. 1% lidocaine was utilized for local anesthesia. Under sonographic guidance, a micropuncture needle was inserted into the right common femoral artery. Ultrasound documentation was obtained. The vessel was noted to be patent. This was up sized to a Bentson. A 5 French sheath was inserted. A cobra 2 catheter was advanced into the aorta. The Cobra was advanced over the Bentson wire into the celiac axis. Angiography was performed. The catheter was then advanced into the splenic artery. Arteriography was performed. Delayed images demonstrate patency of the portal vein. The catheter was retracted then advanced over a glidewire into the common hepatic artery. Angiography was performed. It was then advanced into the right hepatic artery. Subsequent arteriography was performed. A Renegade high flow catheter was then advanced through the Cobra 2 catheter and into the right hepatic arterial circulation. Four branches of the right hepatic artery were selected and embolized with a combination of 500-700 micron embospheres and Gel-Foam slurry. The  microcatheter was removed and final imaging through the Cobra 2 catheter was performed. The Cobra catheter was retracted to the right external iliac artery and femoral angiography was performed. The catheter was removed with the sheath left in place. An ExoSeal device was deployed for hemostasis. FINDINGS: Celiac arteriography delineates anatomy. Splenic arteriography with delayed images demonstrate patency of the splenic and portal veins. Hepatic arterial injection delineates anatomy throughout the liver. There is a large filling defect within the right lobe. Within the filling defect, there are abnormal vessels and  pooling of contrast. These areas are worrisome for pseudoaneurysm formation or possibly active contrast extravasation. It may simply represent foci of tumor blush. These areas correspond to the abnormal mass with hemorrhage on the accompanying MRI of the abdomen. After embolization, the abnormal vasculature was no longer filling. A wedge-shaped defect within the inferior right lobe of the liver was identified. This is consistent with successful embolization of the abnormal right lobe lesion. 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 A combination of embospheres and Gel-Foam slurry was utilized. Electronically Signed   By: Marybelle Killings M.D.   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 A total of Versed 3 mg and Fentanyl 100 mcg was administered intravenously. Moderate Sedation Time: 70 minutes. The patient's level of consciousness and vital signs were monitored continuously by radiology nursing throughout the procedure under my direct supervision. CONTRAST:  150 cc Isovue-300 FLUOROSCOPY TIME:  Fluoroscopy Time: 20 minutes 12 seconds (1,190 mGy). COMPLICATIONS: None immediate. PROCEDURE: Informed consent was obtained from the patient following explanation of the procedure, risks, benefits and alternatives. The patient understands, agrees and consents for the procedure. All questions were addressed. A time out was performed prior to the initiation of the procedure. Maximal barrier sterile technique utilized including caps, mask, sterile gowns, sterile gloves, large sterile drape, hand hygiene, and Betadine prep. The right groin was  prepped and draped in a sterile fashion. 1% lidocaine was utilized for local anesthesia. Under sonographic guidance, a micropuncture needle was inserted into the right common femoral artery. Ultrasound documentation was obtained. The vessel was noted to be patent. This was up sized to a Bentson. A 5 French sheath was inserted. A cobra 2 catheter was advanced into the aorta. The Cobra was advanced over the Bentson wire into the celiac axis. Angiography was performed. The catheter was then advanced into the splenic artery. Arteriography was performed. Delayed images demonstrate patency of the portal vein. The catheter was retracted then advanced over a glidewire into the common hepatic artery. Angiography was performed. It was then advanced into the right hepatic artery. Subsequent arteriography was performed. A Renegade high flow catheter was then advanced through the Cobra 2 catheter and into the right hepatic arterial circulation. Four branches of the right hepatic artery were selected and embolized with a combination of 500-700 micron embospheres and Gel-Foam slurry. The microcatheter was removed and final imaging through the Cobra 2 catheter was performed. The Cobra catheter was retracted to the right external iliac artery and femoral angiography was performed. The catheter was removed with the sheath left in place. An ExoSeal device was deployed for hemostasis. FINDINGS: Celiac arteriography delineates anatomy. Splenic arteriography with delayed images demonstrate patency of the splenic and portal veins. Hepatic arterial  injection delineates anatomy throughout the liver. There is a large filling defect within the right lobe. Within the filling defect, there are abnormal vessels and pooling of contrast. These areas are worrisome for pseudoaneurysm formation or possibly active contrast extravasation. It may simply represent foci of tumor blush. These areas correspond to the abnormal mass with hemorrhage on the  accompanying MRI of the abdomen. After embolization, the abnormal vasculature was no longer filling. A wedge-shaped defect within the inferior right lobe of the liver was identified. This is consistent with successful embolization of the abnormal right lobe lesion. 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 A combination of embospheres and Gel-Foam slurry was utilized. Electronically Signed   By: Marybelle Killings M.D.   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 A total of Versed 3 mg and Fentanyl 100 mcg was administered intravenously. Moderate Sedation Time: 70 minutes. The patient's level of consciousness and vital signs were monitored continuously by radiology nursing throughout the procedure under my direct supervision. CONTRAST:  150 cc Isovue-300 FLUOROSCOPY TIME:  Fluoroscopy Time: 20 minutes 12 seconds (1,190 mGy). COMPLICATIONS: None immediate. PROCEDURE: Informed consent was obtained from the patient following explanation of the procedure, risks, benefits and alternatives. The patient understands, agrees and consents for the procedure. All questions were addressed. A time out was performed prior to the initiation of the procedure. Maximal barrier sterile technique utilized including caps, mask, sterile gowns, sterile gloves, large sterile drape, hand hygiene, and Betadine prep. The right groin was prepped and draped in a sterile fashion. 1% lidocaine was utilized for local anesthesia. Under sonographic guidance, a micropuncture needle was inserted into the right common femoral artery. Ultrasound documentation was obtained. The  vessel was noted to be patent. This was up sized to a Bentson. A 5 French sheath was inserted. A cobra 2 catheter was advanced into the aorta. The Cobra was advanced over the Bentson wire into the celiac axis. Angiography was performed. The catheter was then advanced into the splenic artery. Arteriography was performed. Delayed images demonstrate patency of the portal vein. The catheter was retracted then advanced over a glidewire into the common hepatic artery. Angiography was performed. It was then advanced into the right hepatic artery. Subsequent arteriography was performed. A Renegade high flow catheter was then advanced through the Cobra 2 catheter and into the right hepatic arterial circulation. Four branches of the right hepatic artery were selected and embolized with a combination of 500-700 micron embospheres and Gel-Foam slurry. The microcatheter was removed and final imaging through the Cobra 2 catheter was performed. The Cobra catheter was retracted to the right external iliac artery and femoral angiography was performed. The catheter was removed with the sheath left in place. An ExoSeal device was deployed for hemostasis. FINDINGS: Celiac arteriography delineates anatomy. Splenic arteriography with delayed images demonstrate patency of the splenic and portal veins. Hepatic arterial injection delineates anatomy throughout the liver. There is a large filling defect within the right lobe. Within the filling defect, there are abnormal vessels and pooling of contrast. These areas are worrisome for pseudoaneurysm formation or possibly active contrast extravasation. It may simply represent foci of tumor blush. These areas correspond to the abnormal mass with hemorrhage on the accompanying MRI of the abdomen. After embolization,  the abnormal vasculature was no longer filling. A wedge-shaped defect within the inferior right lobe of the liver was identified. This is consistent with successful embolization of the  abnormal right lobe lesion. 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 A combination of embospheres and Gel-Foam slurry was utilized. Electronically Signed   By: Marybelle Killings M.D.   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 A total of Versed 3 mg and Fentanyl 100 mcg was administered intravenously. Moderate Sedation Time: 70 minutes. The patient's level of consciousness and vital signs were monitored continuously by radiology nursing throughout the procedure under my direct supervision. CONTRAST:  150 cc Isovue-300 FLUOROSCOPY TIME:  Fluoroscopy Time: 20 minutes 12 seconds (1,190 mGy). COMPLICATIONS: None immediate. PROCEDURE: Informed consent was obtained from the patient following explanation of the procedure, risks, benefits and alternatives. The patient understands, agrees and consents for the procedure. All questions were addressed. A time out was performed prior to the initiation of the procedure. Maximal barrier sterile technique utilized including caps, mask, sterile gowns, sterile gloves, large sterile drape, hand hygiene, and Betadine prep. The right groin was prepped and draped in a sterile fashion. 1% lidocaine was utilized for local anesthesia. Under sonographic guidance, a micropuncture needle was inserted into the right common femoral artery. Ultrasound documentation was obtained. The vessel was noted to be patent. This was up sized to a Bentson. A 5 French sheath was inserted. A cobra 2 catheter was advanced into the aorta. The Cobra was advanced over the Bentson wire into the celiac axis. Angiography was performed. The  catheter was then advanced into the splenic artery. Arteriography was performed. Delayed images demonstrate patency of the portal vein. The catheter was retracted then advanced over a glidewire into the common hepatic artery. Angiography was performed. It was then advanced into the right hepatic artery. Subsequent arteriography was performed. A Renegade high flow catheter was then advanced through the Cobra 2 catheter and into the right hepatic arterial circulation. Four branches of the right hepatic artery were selected and embolized with a combination of 500-700 micron embospheres and Gel-Foam slurry. The microcatheter was removed and final imaging through the Cobra 2 catheter was performed. The Cobra catheter was retracted to the right external iliac artery and femoral angiography was performed. The catheter was removed with the sheath left in place. An ExoSeal device was deployed for hemostasis. FINDINGS: Celiac arteriography delineates anatomy. Splenic arteriography with delayed images demonstrate patency of the splenic and portal veins. Hepatic arterial injection delineates anatomy throughout the liver. There is a large filling defect within the right lobe. Within the filling defect, there are abnormal vessels and pooling of contrast. These areas are worrisome for pseudoaneurysm formation or possibly active contrast extravasation. It may simply represent foci of tumor blush. These areas correspond to the abnormal mass with hemorrhage on the accompanying MRI of the abdomen. After embolization, the abnormal vasculature was no longer filling. A wedge-shaped defect within the inferior right lobe of the liver was identified. This is consistent with successful embolization of the abnormal right lobe lesion. 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 A combination of embospheres and Gel-Foam slurry was utilized. Electronically Signed   By: Marybelle Killings M.D.   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 A total of Versed 3 mg and Fentanyl 100 mcg was administered intravenously. Moderate Sedation Time: 70 minutes. The patient's level of consciousness and vital signs were monitored continuously by radiology nursing throughout the procedure under my direct supervision. CONTRAST:  150 cc Isovue-300 FLUOROSCOPY TIME:  Fluoroscopy Time: 20 minutes 12 seconds (1,190 mGy). COMPLICATIONS: None immediate. PROCEDURE: Informed consent was obtained from the patient following explanation of the procedure, risks, benefits and alternatives. The patient understands, agrees and consents for the procedure. All questions were addressed. A time out was performed prior to the initiation of the procedure. Maximal barrier sterile technique utilized including caps, mask, sterile gowns, sterile gloves, large sterile drape, hand hygiene, and Betadine prep. The right groin was prepped and draped in a sterile fashion. 1% lidocaine was utilized for local anesthesia. Under sonographic guidance, a micropuncture needle was inserted into the right common femoral artery. Ultrasound documentation was obtained. The vessel was noted to be patent. This was up sized to a Bentson. A 5 French sheath was inserted. A cobra 2 catheter was advanced into the aorta. The Cobra was advanced over the Bentson wire into the celiac axis. Angiography was performed. The catheter was then advanced into the splenic artery. Arteriography was performed. Delayed images demonstrate patency of the portal vein. The catheter was retracted then advanced over a glidewire into the common hepatic artery. Angiography  was performed. It was then advanced into the right hepatic artery. Subsequent arteriography was performed. A Renegade high flow catheter was then advanced through the Cobra 2 catheter and into the right hepatic arterial circulation. Four branches of the right hepatic artery were selected and embolized with a combination of 500-700 micron embospheres and Gel-Foam slurry. The microcatheter was removed and final imaging through the Cobra 2 catheter was performed. The Cobra catheter was retracted to the right external iliac artery and femoral angiography was performed. The catheter was removed with the sheath left in place. An ExoSeal device was deployed for hemostasis. FINDINGS: Celiac arteriography delineates anatomy. Splenic arteriography with delayed images demonstrate patency of the splenic and portal veins. Hepatic arterial injection delineates anatomy throughout the liver. There is a large filling defect within the right lobe. Within the filling defect, there are abnormal vessels and pooling of contrast. These areas are worrisome for pseudoaneurysm formation or possibly active contrast extravasation. It may simply represent foci of tumor blush. These areas correspond to the abnormal mass with hemorrhage on the accompanying MRI of the abdomen. After embolization, the abnormal vasculature was no longer filling. A wedge-shaped defect within the inferior right lobe of the liver was identified. This is consistent with successful embolization of the abnormal right lobe lesion. 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 A combination of embospheres and Gel-Foam slurry was utilized. Electronically Signed   By: Marybelle Killings M.D.   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 A total of Versed 3 mg and Fentanyl 100 mcg was administered intravenously. Moderate Sedation Time: 70 minutes. The patient's level of consciousness  and vital signs were monitored continuously by radiology nursing throughout the procedure under my direct supervision. CONTRAST:  150 cc Isovue-300 FLUOROSCOPY TIME:  Fluoroscopy Time: 20 minutes 12 seconds (1,190 mGy). COMPLICATIONS: None immediate. PROCEDURE: Informed consent was obtained from the patient following explanation of the procedure, risks, benefits and alternatives. The patient understands, agrees and consents for the procedure. All questions were addressed. A time out was performed prior to the initiation of the procedure. Maximal barrier sterile technique utilized including caps, mask, sterile gowns, sterile gloves, large sterile drape, hand hygiene, and Betadine prep. The right groin was prepped and draped in a sterile fashion. 1% lidocaine was utilized for local anesthesia. Under sonographic guidance, a micropuncture needle was inserted into the right common femoral artery. Ultrasound documentation was obtained. The vessel was noted to be patent. This was up sized to a Bentson. A 5 French sheath was inserted. A cobra 2 catheter was advanced into the aorta. The Cobra was advanced over the Bentson wire into the celiac axis. Angiography was performed. The catheter was then advanced into the splenic artery. Arteriography was performed. Delayed images demonstrate patency of the portal vein. The catheter was retracted then advanced over a glidewire into the common hepatic artery. Angiography was performed. It was then advanced into the right hepatic artery. Subsequent arteriography was performed. A Renegade high flow catheter was then advanced through the Cobra 2 catheter and into the right hepatic arterial circulation. Four branches  of the right hepatic artery were selected and embolized with a combination of 500-700 micron embospheres and Gel-Foam slurry. The microcatheter was removed and final imaging through the Cobra 2 catheter was performed. The Cobra catheter was retracted to the right external iliac artery and femoral angiography was performed. The catheter was removed with the sheath left in place. An ExoSeal device was deployed for hemostasis. FINDINGS: Celiac arteriography delineates anatomy. Splenic arteriography with delayed images demonstrate patency of the splenic and portal veins. Hepatic arterial injection delineates anatomy throughout the liver. There is a large filling defect within the right lobe. Within the filling defect, there are abnormal vessels and pooling of contrast. These areas are worrisome for pseudoaneurysm formation or possibly active contrast extravasation. It may simply represent foci of tumor blush. These areas correspond to the abnormal mass with hemorrhage on the accompanying MRI of the abdomen. After embolization, the abnormal vasculature was no longer filling. A wedge-shaped defect within the inferior right lobe of the liver was identified. This is consistent with successful embolization of the abnormal right lobe lesion. 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 A combination of embospheres and Gel-Foam slurry was utilized. Electronically Signed   By: Marybelle Killings M.D.   On: 04/28/2018 09:01   Ir Embo Art  [place] Guide Roadmapping  Addendum Date: 04/28/2018   ADDENDUM REPORT: 04/28/2018 10:56 ADDENDUM: There were form vessels in the right hepatic arterial tree that were selected. These are all fifth order. Electronically Signed   By: Marybelle Killings M.D.   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 A total of Versed 3 mg and Fentanyl 100 mcg was administered intravenously. Moderate Sedation Time: 70 minutes. The patient's level of consciousness and vital signs were monitored continuously by radiology nursing throughout the procedure under my direct supervision.  CONTRAST:  150 cc Isovue-300 FLUOROSCOPY TIME:  Fluoroscopy Time: 20 minutes 12 seconds (1,190 mGy). COMPLICATIONS: None immediate. PROCEDURE: Informed consent was obtained from the patient following explanation of the procedure, risks, benefits and alternatives. The patient understands, agrees and consents for the procedure. All questions were addressed. A time out was performed prior to the initiation of the procedure. Maximal barrier sterile technique utilized including caps, mask, sterile gowns, sterile gloves, large sterile drape, hand hygiene, and Betadine prep. The right groin was prepped and draped in a sterile fashion. 1% lidocaine was utilized for local anesthesia. Under sonographic guidance, a micropuncture needle was inserted into the right common femoral artery. Ultrasound documentation was obtained. The vessel was noted to be patent. This was up sized to a Bentson. A 5 French sheath was inserted. A cobra 2 catheter was advanced into the aorta. The Cobra was advanced over the Bentson wire into the celiac axis. Angiography was performed. The catheter was then advanced into the splenic artery. Arteriography was performed. Delayed images demonstrate patency of the portal vein. The catheter was retracted then advanced over a glidewire into the common hepatic artery. Angiography was performed. It was then advanced into the right hepatic artery. Subsequent arteriography was performed. A Renegade high flow catheter was then advanced through the Cobra 2 catheter and into the right hepatic arterial  circulation. Four branches of the right hepatic artery were selected and embolized with a combination of 500-700 micron embospheres and Gel-Foam slurry. The microcatheter was removed and final imaging through the Cobra 2 catheter was performed. The Cobra catheter was retracted to the right external iliac artery and femoral angiography was performed. The catheter was removed with the sheath left in place. An ExoSeal device was deployed for hemostasis. FINDINGS: Celiac arteriography delineates anatomy. Splenic arteriography with delayed images demonstrate patency of the splenic and portal veins. Hepatic arterial injection delineates anatomy throughout the liver. There is a large filling defect within the right lobe. Within the filling defect, there are abnormal vessels and pooling of contrast. These areas are worrisome for pseudoaneurysm formation or possibly active contrast extravasation. It may simply represent foci of tumor blush. These areas correspond to the abnormal mass with hemorrhage on the accompanying MRI of the abdomen. After embolization, the abnormal vasculature was no longer filling. A wedge-shaped defect within the inferior right lobe of the liver was identified. This is consistent with successful embolization of the abnormal right lobe lesion. 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 A combination of embospheres and Gel-Foam slurry was utilized. Electronically Signed: By: Marybelle Killings M.D. On: 04/28/2018 09:03    Scheduled Meds: . Darbepoetin Alfa  200 mcg Subcutaneous Q7 days  . folic acid  1 mg Oral Daily  . metoprolol tartrate  25 mg Oral BID  . senna-docusate  2 tablet Oral QHS   Continuous Infusions: . azithromycin Stopped (04/27/18 2251)  . cefTRIAXone (ROCEPHIN)  IV Stopped (04/27/18 1840)    Principal Problem:   Hemoperitoneum, nontraumatic Active Problems:   Anemia   Liver mass   Tachycardia   Hyperglycemia    Leukocytosis   Hypertension   Obesity    Time spent: >35 minutes    Kinnie Feil  [facility] Pager [phone]. If 7PM-7AM, please contact night-coverage at [email or web address], password [facility] 04/28/2018, 11:32 AM  LOS: 4 days

## 2018-04-28 NOTE — Progress Notes (Signed)
Referring Physician(s): Dr Ok Anis  Supervising Physician: Jacqulynn Cadet  Patient Status:  [facility] - In-pt  Chief Complaint:  Hepatic adenoma- hemorrhage  Subjective:  Embolization in IR 5/8 Has done well overnight Slept well Denies pain Denies N/V Hg stable: 8.2 this am  Allergies: Sulfa antibiotics  Medications: Prior to Admission medications   Medication Sig Start Date End Date Taking? Authorizing Provider  LO LOESTRIN FE 1 MG-10 MCG / 10 MCG tablet Take 1 tablet by mouth daily. 04/08/18  Yes [provider]  triamterene-hydrochlorothiazide (DYAZIDE) 37.5-25 MG capsule Take 1 capsule by mouth daily. 04/08/18  Yes [provider]     Vital Signs: BP (!) 145/94   Pulse (!) 120   Temp 98.8 F (37.1 C) (Oral)   Resp (!) 35   LMP 04/24/2018   SpO2 98%   Physical Exam  Constitutional: She is oriented to person, place, and time.  Abdominal: Normal appearance and bowel sounds are normal.  Neurological: She is alert and oriented to person, place, and time.  Skin: Skin is warm and dry.  Right groin NT no bleeding No hematoma Rt foot 2+ pulases  Nursing note and vitals reviewed.   Imaging: Dg Chest 2 View  Result Date: 04/27/2018 CLINICAL DATA:  Fever. EXAM: CHEST - 2 VIEW COMPARISON:  None. FINDINGS: Mild cardiomegaly is noted. No pneumothorax is noted. Moderate left basilar opacity is noted concerning for pneumonia or atelectasis with associated pleural effusion. Mild right basilar atelectasis is noted. Bony thorax is unremarkable. IMPRESSION: Moderate left basilar opacity is noted concerning for pneumonia or atelectasis with associated pleural effusion. Mild right basilar atelectasis is noted. Electronically Signed   By: Marijo Conception, M.D.   On: 04/27/2018 14:26   Mr Abdomen W Wo Contrast  Result Date: 04/25/2018 CLINICAL DATA:  Liver lesion on CT, nontraumatic hemoperitoneum EXAM: MRI ABDOMEN WITHOUT AND WITH CONTRAST TECHNIQUE:  Multiplanar multisequence MR imaging of the abdomen was performed both before and after the administration of intravenous contrast. CONTRAST:  66mL MULTIHANCE GADOBENATE DIMEGLUMINE 529 MG/ML IV SOLN COMPARISON:  CT abdomen/pelvis dated 04/24/2018 FINDINGS: Motion degraded images. Lower chest: Mild patchy bilateral lower lobe opacities, left greater than right, likely reflecting atelectasis. Trace bilateral pleural effusions. Hepatobiliary: 9.1 x 7.8 x 5.7 cm mass with peripheral enhancement and intralesional hemorrhage centered in segment 6. Associated perihepatic hemorrhage extending posteriorly outside the liver capsule into the hepatorenal fossa. The lesion itself cannot be easily characterized on MR given acute hemorrhage/rupture. Overall clinical picture favors rupture of a large hepatic adenoma, although cavernous hemangioma or FNH are also possibilities. Additional 3.7 x 2.4 cm lesion in segment 6 (series 8001/image 21), conspicuous on T2, with avid solid enhancement, which is overall favored to reflect [place]. Dominant 2.9 cm enhancing lesion in segment 4B (series 15001/image 15). Additional scattered 12-13 mm lesions in the right liver (series 15001/images 10 and 16) and in the left hepatic lobe (series 15001/images 28 and 36), not conspicuous on T2 but enhancing, favoring small hepatic adenomas. Gallbladder is within normal limits. No intrahepatic or extrahepatic ductal dilatation. Pancreas:  Within normal limits. Spleen:  Within normal limits. Adrenals/Urinary Tract:  Adrenal glands are within normal limits. Kidneys within normal limits.  No hydronephrosis. Stomach/Bowel: Stomach is within normal limits. Visualized bowel is unremarkable. Vascular/Lymphatic:  No evidence of abdominal aortic aneurysm. No suspicious abdominal lymphadenopathy. Other: Hemorrhage along the hepatorenal fossa (series 5001/image 24), likely related to rupture of the dominant liver lesion. Associated mild ascites in the  right upper  abdomen. Musculoskeletal: No focal osseous lesions. IMPRESSION: Dominant 9.1 cm mass centered in segment 6 with intralesional hemorrhage/rupture, incompletely characterized in the acute setting, although favoring rupture of a large hepatic adenoma (given the clinical setting) over cavernous hemangioma or FNH. Associated fluid/hemorrhage in the right upper abdomen and along the hepatorenal fossa. Additional 3.7 cm lesion in segment 6 which favors a benign FNH. Additional scattered enhancing lesions measuring up to 2.9 cm in segment 4B, favoring hepatic adenomas, benign. Electronically Signed   By: Julian Hy M.D.   On: 04/25/2018 07:17   Ct Abdomen Pelvis W Contrast  Result Date: 04/24/2018 CLINICAL DATA:  Acute right upper quadrant abdominal pain. EXAM: CT ABDOMEN AND PELVIS WITH CONTRAST TECHNIQUE: Multidetector CT imaging of the abdomen and pelvis was performed using the standard protocol following bolus administration of intravenous contrast. CONTRAST:  148mL ISOVUE-300 IOPAMIDOL (ISOVUE-300) INJECTION 61% COMPARISON:  Abdominal sonogram from earlier today. FINDINGS: Lower chest: No significant pulmonary nodules or acute consolidative airspace disease. Hepatobiliary: There is an 8.5 x 6.1 cm hypodense posterior right liver lobe mass (series 2/image 27) with irregular outer contour, which is directly contiguous with small to moderate volume posterior right perihepatic acute hyperdense hemoperitoneum. No additional liver masses. Normal gallbladder with no radiopaque cholelithiasis. No biliary ductal dilatation. Pancreas: Normal, with no mass or duct dilation. Spleen: Normal size. No mass. Adrenals/Urinary Tract: Normal adrenals. Normal kidneys with no hydronephrosis and no renal mass. Normal bladder. Stomach/Bowel: Normal non-distended stomach. Normal caliber small bowel with no small bowel wall thickening. Normal appendix. Normal large bowel with no diverticulosis, large bowel wall thickening or  pericolonic fat stranding. Vascular/Lymphatic: Normal caliber abdominal aorta. Patent portal, splenic, hepatic and renal veins. No pathologically enlarged lymph nodes in the abdomen or pelvis. Reproductive: Grossly normal uterus.  No adnexal mass. Other: No pneumoperitoneum. Small to moderate volume hemoperitoneum in the right paracolic gutter and pelvic cul-de-sac. Musculoskeletal: No aggressive appearing focal osseous lesions. No evidence of a fracture. IMPRESSION: 1. Large 8.5 cm hypodense indeterminate posterior right liver lobe mass, which is directly contiguous with small to moderate volume posterior right perihepatic acute hemoperitoneum. Differential includes acute hemorrhagic rupture of an underlying liver mass (such as an hepatic adenoma in a young female) into the perihepatic space versus traumatic liver laceration. Short-term follow-up MRI abdomen without and with IV contrast recommended for further characterization. 2. Small to moderate volume hemoperitoneum in the right paracolic gutter and pelvic cul-de-sac. These results were called by telephone at the time of interpretation on 04/24/2018 at 8:05 am to [facility], who verbally acknowledged these results. Electronically Signed   By: Ilona Sorrel M.D.   On: 04/24/2018 08:07    Labs:  CBC: Recent Labs    04/25/18 1505 04/26/18 0435 04/27/18 0443 04/28/18 0403  WBC 10.6* 9.9 10.3 12.2*  HGB 8.4* 8.0* 8.1* 8.2*  HCT 26.2* 24.4* 24.9* 26.1*  PLT 201 182 217 289    COAGS: Recent Labs    04/24/18 0856  INR 1.07  APTT 24    BMP: Recent Labs    04/24/18 0231 04/25/18 0956  NA 142 138  K 4.1 3.6  CL 107 104  CO2 24 26  GLUCOSE 154* 125*  BUN 15 6  CALCIUM 9.5 8.5*  CREATININE 0.81 0.78  GFRNONAA >60 >60  GFRAA >60 >60    LIVER FUNCTION TESTS: Recent Labs    04/24/18 0231 04/25/18 0956  BILITOT 0.5 0.4  AST 67* 273*  ALT 65* 420*  ALKPHOS  56 63  PROT 8.0 6.3*  ALBUMIN 4.4 3.6    Assessment and  Plan:  Hemorrhagic hepatic adenoma Embolization in IR 5/8 Stable this am No complaints Hg stable  Electronically Signed: Sharmaine Bain A, PA-C 04/28/2018, 6:54 AM   I spent a total of 15 Minutes at the the patient's bedside AND on the patient's hospital floor or unit, greater than 50% of which was counseling/coordinating care for hepatic adenoma embolization

## 2018-04-29 LAB — CBC
HEMATOCRIT: 27.5 % — AB (ref 36.0–46.0)
HEMOGLOBIN: 8.8 g/dL — AB (ref 12.0–15.0)
MCH: 29.3 pg (ref 26.0–34.0)
MCHC: 32 g/dL (ref 30.0–36.0)
MCV: 91.7 fL (ref 78.0–100.0)
Platelets: 341 10*3/uL (ref 150–400)
RBC: 3 MIL/uL — AB (ref 3.87–5.11)
RDW: 14.6 % (ref 11.5–15.5)
WBC: 12.1 10*3/uL — ABNORMAL HIGH (ref 4.0–10.5)

## 2018-04-29 MED ORDER — ACETAMINOPHEN 325 MG PO TABS
650.0000 mg | ORAL_TABLET | Freq: Four times a day (QID) | ORAL | Status: DC | PRN
Start: 2018-04-29 — End: 2018-05-01
  Administered 2018-05-01: 650 mg via ORAL
  Filled 2018-04-29: qty 2

## 2018-04-29 MED ORDER — DARBEPOETIN ALFA 200 MCG/0.4ML IJ SOSY: 200.0000 ug | PREFILLED_SYRINGE | INTRAMUSCULAR | Status: DC

## 2018-04-29 MED ORDER — TRAMADOL HCL 50 MG PO TABS
50.0000 mg | ORAL_TABLET | Freq: Four times a day (QID) | ORAL | Status: DC | PRN
  Administered 2018-04-30: 50 mg via ORAL
  Filled 2018-04-29 (×2): qty 1

## 2018-04-29 MED ORDER — BUTALBITAL-APAP-CAFFEINE 50-325-40 MG PO TABS
1.0000 | ORAL_TABLET | Freq: Four times a day (QID) | ORAL | Status: DC | PRN
  Administered 2018-04-30 (×2): 1 via ORAL
  Filled 2018-04-29 (×2): qty 1

## 2018-04-29 MED ORDER — METOPROLOL TARTRATE 50 MG PO TABS
50.0000 mg | ORAL_TABLET | Freq: Two times a day (BID) | ORAL | Status: DC
  Administered 2018-04-29 – 2018-05-01 (×4): 50 mg via ORAL
  Filled 2018-04-29 (×4): qty 1

## 2018-04-29 MED ORDER — OXYCODONE HCL 5 MG PO TABS
5.0000 mg | ORAL_TABLET | Freq: Four times a day (QID) | ORAL | Status: DC | PRN
  Administered 2018-04-29 – 2018-04-30 (×2): 5 mg via ORAL
  Filled 2018-04-29 (×2): qty 1

## 2018-04-29 NOTE — Progress Notes (Signed)
Patient ID: Latoya Kramer, female   DOB: Feb 24, 1991, 27 y.o.   MRN: [id number]  IR procedure 5/8 Successful hepatic arterial embolization of a hemorrhagic right hepatic lobe mass. A combination of embospheres and Gel-Foam slurry was utilized  Doing well  Low grade temp today  Hg 8.8 this am No complaints Rt groin no bleeding or hematoma Rt foot 2+ pulses  Plan per [facility] and [facility]

## 2018-04-29 NOTE — Progress Notes (Signed)
[place] TEAM 1 - Stepdown/ICU TEAM  Latoya Kramer  KKX:[id number] DOB: 14-Oct-1991 DOA: 04/24/2018 PCP: Patient, No Pcp Per    Brief Narrative:  27yo F [employer] w/ a hx of HTN and obesity who presented to the ED w/ abdominal pain.  CT abdom noted a posterior right liver lobe mass with moderate volume perihepatic acute hemoperitoneum.    Significant Events: 5/5 admit w/ hemopritoneum 5/8 R hepatic lobe adenoma embolization    Subjective: The patient states she feels better overall.  She continues to have a nagging intermittent diffuse headache.  She continues to have right-sided flank and abdominal pain but states this is not as severe.  She reports a poor appetite.  She reports feeling weak in general.  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 - SIRS  S/p IR embolization 5/8 -continue to follow hemoglobin in the serial fashion - Gen Surgery has signed off   Fever Suspect this is due to her embolization procedure - no other sx to suggest PNA - will plan for a 5 day course of abx and follow   Acute blood loss anemia  Due to intra-abdominal bleeding - the patient will not accept blood transfusions -attempt to minimize phlebotomy but at the same time must follow hemoglobin in serial fashion at this time - supplemented B12 - cont to dose w/ folic acid - dosed w/ IV Fe - cont epo while hospitalized    Recent Labs  Lab 04/25/18 0956 04/25/18 1505 04/26/18 0435 04/27/18 0443 04/28/18 0403 04/29/18 0713  HGB 8.7* 8.4* 8.0* 8.1* 8.2* 8.8*    HTN blood pressure stable  Morbid obesity   DVT prophylaxis: SCDs Code Status: FULL CODE Family Communication: Discussed care with patient and mother at bedside at great length Disposition Plan: SDU  Consultants:  Gen Surgery   IR  Antimicrobials:  Rocephin 5/8 > Azithro 5/8 >  Objective: Blood pressure 139/90, pulse (!) 123, temperature 98.5 F (36.9 C), temperature source Oral, resp.  rate (!) 23, last menstrual period 04/24/2018, SpO2 94 %.  Intake/Output Summary (Last 24 hours) at 04/29/2018 1609 Last data filed at 04/29/2018 0945 Gross per 24 hour  Intake 830 ml  Output -  Net 830 ml   There were no vitals filed for this visit.  Examination: General: No acute respiratory distress Lungs: Clear to auscultation bilaterally - mild bibasilar crackles - no wheezing   Cardiovascular: tachycardic but regular w/o M  Abdomen: mildly tender across R abdom w/o change - no rebound - bs+ - no mass  Extremities: No edema bilateral lower extremities  CBC: Recent Labs  Lab 04/24/18 0856  04/27/18 0443 04/28/18 0403 04/29/18 0713  WBC 11.3*   < > 10.3 12.2* 12.1*  NEUTROABS 9.0*  --   --   --   --   HGB 9.4*   < > 8.1* 8.2* 8.8*  HCT 29.3*   < > 24.9* 26.1* 27.5*  MCV 90.7   < > 91.2 90.0 91.7  PLT 219   < > 217 289 341   < > = values in this interval not displayed.   Basic Metabolic Panel: Recent Labs  Lab 04/24/18 0231 04/25/18 0956  NA 142 138  K 4.1 3.6  CL 107 104  CO2 24 26  GLUCOSE 154* 125*  BUN 15 6  CREATININE 0.81 0.78  CALCIUM 9.5 8.5*   GFR: CrCl cannot be calculated (Unknown ideal weight.).  Liver Function Tests: Recent Labs  Lab  04/24/18 0231 04/25/18 0956  AST 67* 273*  ALT 65* 420*  ALKPHOS 56 63  BILITOT 0.5 0.4  PROT 8.0 6.3*  ALBUMIN 4.4 3.6   Recent Labs  Lab 04/24/18 0231  LIPASE 35   Coagulation Profile: Recent Labs  Lab 04/24/18 0856  INR 1.07    HbA1C: Hgb A1c MFr Bld  Date/Time Value Ref Range Status  04/24/2018 09:04 AM 5.8 (H) 4.8 - 5.6 % Final    Comment:    (NOTE) Pre diabetes:          5.7%-6.4% Diabetes:              >6.4% Glycemic control for   <7.0% adults with diabetes     CBG: Recent Labs  Lab 04/26/18 1239 04/26/18 1717 04/26/18 2130 04/27/18 0825 04/27/18 1845  GLUCAP 125* 113* 99 124* 84    Scheduled Meds: . [START ON 05/03/2018] Darbepoetin Alfa  200 mcg Subcutaneous Q Tue-1800   . folic acid  1 mg Oral Daily  . metoprolol tartrate  25 mg Oral BID  . senna-docusate  2 tablet Oral QHS     LOS: 5 days   Cherene Altes, MD [facility] Office  [phone] Pager - Text Page per Amion as per below:  On-Call/Text Page:      [email or web address]      password [email or web address]  If 7PM-7AM, please contact night-coverage [email or web address] Password [email or web address] 04/29/2018, 4:09 PM

## 2018-04-29 NOTE — Progress Notes (Signed)
Pt had fever tylenol given.  Mother requested for ibuprofen MD notified.  MD called back and said pt is not a candidate for ibuprofen due to concern for bleeding.  Pt and mother was notified and they verbalized understanding.  Will continue to monitor.

## 2018-04-29 NOTE — Progress Notes (Signed)
Patient ID: Latoya Kramer, female   DOB: 07-22-1991, 27 y.o.   MRN: [id number]       Subjective: Patient states she feels warm.  She states her appetite isn't great, but she doesn't have any nausea.  She hasn't mobilized yet.  Objective: Vital signs in last 24 hours: Temp:  [98.1 F (36.7 C)-102.7 F (39.3 C)] 100 F (37.8 C) (05/10 0920) Pulse Rate:  [112-130] 123 (05/10 0920) Resp:  [22-32] 32 (05/10 0440) BP: (133-154)/(90-98) 153/93 (05/10 0920) SpO2:  [93 %-96 %] 94 % (05/10 0025) Last BM Date: 04/28/18  Intake/Output from previous day: 05/09 0701 - 05/10 0700 In: 1032 [P.O.:682; IV Piggyback:350] Out: 400 [Urine:400] Intake/Output this shift: Total I/O In: 340 [P.O.:340] Out: -   PE: Abd: soft, minimally tender in RUQ, +BS, ND Heart: regular, but tachy Lungs: CTAB  Lab Results:  Recent Labs    04/28/18 0403 04/29/18 0713  WBC 12.2* 12.1*  HGB 8.2* 8.8*  HCT 26.1* 27.5*  PLT 289 341   BMET No results for input(s): NA, K, CL, CO2, GLUCOSE, BUN, CREATININE, CALCIUM in the last 72 hours. PT/INR No results for input(s): LABPROT, INR in the last 72 hours. CMP     Component Value Date/Time   NA 138 04/25/2018 0956   K 3.6 04/25/2018 0956   CL 104 04/25/2018 0956   CO2 26 04/25/2018 0956   GLUCOSE 125 (H) 04/25/2018 0956   BUN 6 04/25/2018 0956   CREATININE 0.78 04/25/2018 0956   CALCIUM 8.5 (L) 04/25/2018 0956   PROT 6.3 (L) 04/25/2018 0956   ALBUMIN 3.6 04/25/2018 0956   AST 273 (H) 04/25/2018 0956   ALT 420 (H) 04/25/2018 0956   ALKPHOS 63 04/25/2018 0956   BILITOT 0.4 04/25/2018 0956   GFRNONAA >60 04/25/2018 0956   GFRAA >60 04/25/2018 0956   Lipase     Component Value Date/Time   LIPASE 35 04/24/2018 0231       Studies/Results: Dg Chest 2 View  Result Date: 04/27/2018 CLINICAL DATA:  Fever. EXAM: CHEST - 2 VIEW COMPARISON:  None. FINDINGS: Mild cardiomegaly is noted. No pneumothorax is noted. Moderate left basilar opacity is  noted concerning for pneumonia or atelectasis with associated pleural effusion. Mild right basilar atelectasis is noted. Bony thorax is unremarkable. IMPRESSION: Moderate left basilar opacity is noted concerning for pneumonia or atelectasis with associated pleural effusion. Mild right basilar atelectasis is noted. Electronically Signed   By: Marijo Conception, M.D.   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 A total of Versed 3 mg and Fentanyl 100 mcg was administered intravenously. Moderate Sedation Time: 70 minutes. The patient's level of consciousness and vital signs were monitored continuously by radiology nursing throughout the procedure under my direct supervision. CONTRAST:  150 cc Isovue-300 FLUOROSCOPY TIME:  Fluoroscopy Time: 20 minutes 12 seconds (1,190 mGy). COMPLICATIONS: None immediate. PROCEDURE: Informed consent was obtained from the patient following explanation of the procedure, risks, benefits and alternatives. The patient understands, agrees and consents for the procedure. All questions were addressed. A time out was performed prior to the initiation of the procedure. Maximal barrier sterile technique utilized including caps, mask, sterile gowns, sterile gloves, large sterile drape, hand hygiene, and Betadine prep. The right groin was prepped and draped in a sterile fashion.  1% lidocaine was utilized for local anesthesia. Under sonographic guidance, a micropuncture needle was inserted into the right common femoral artery. Ultrasound documentation was obtained. The vessel was noted to be patent. This was up sized to a Bentson. A 5 French sheath was inserted. A cobra 2 catheter was  advanced into the aorta. The Cobra was advanced over the Bentson wire into the celiac axis. Angiography was performed. The catheter was then advanced into the splenic artery. Arteriography was performed. Delayed images demonstrate patency of the portal vein. The catheter was retracted then advanced over a glidewire into the common hepatic artery. Angiography was performed. It was then advanced into the right hepatic artery. Subsequent arteriography was performed. A Renegade high flow catheter was then advanced through the Cobra 2 catheter and into the right hepatic arterial circulation. Four branches of the right hepatic artery were selected and embolized with a combination of 500-700 micron embospheres and Gel-Foam slurry. The microcatheter was removed and final imaging through the Cobra 2 catheter was performed. The Cobra catheter was retracted to the right external iliac artery and femoral angiography was performed. The catheter was removed with the sheath left in place. An ExoSeal device was deployed for hemostasis. FINDINGS: Celiac arteriography delineates anatomy. Splenic arteriography with delayed images demonstrate patency of the splenic and portal veins. Hepatic arterial injection delineates anatomy throughout the liver. There is a large filling defect within the right lobe. Within the filling defect, there are abnormal vessels and pooling of contrast. These areas are worrisome for pseudoaneurysm formation or possibly active contrast extravasation. It may simply represent foci of tumor blush. These areas correspond to the abnormal mass with hemorrhage on the accompanying MRI of the abdomen. After embolization, the abnormal vasculature was no longer filling. A wedge-shaped defect within the inferior right lobe of the liver was identified. This is consistent with successful embolization of the abnormal right lobe lesion. IMPRESSION: Successful hepatic arterial embolization of a hemorrhagic right hepatic lobe  mass. A combination of embospheres and Gel-Foam slurry was utilized. Electronically Signed   By: Marybelle Killings M.D.   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 A total of Versed 3 mg and Fentanyl 100 mcg was administered intravenously. Moderate Sedation Time: 70 minutes. The patient's level of consciousness and vital signs were monitored continuously by radiology nursing throughout the procedure under my direct supervision. CONTRAST:  150 cc Isovue-300 FLUOROSCOPY TIME:  Fluoroscopy Time: 20 minutes 12 seconds (1,190 mGy). COMPLICATIONS: None immediate. PROCEDURE: Informed consent was obtained from the patient following explanation of the procedure, risks, benefits and alternatives. The patient understands, agrees and consents for the procedure. All questions were addressed. A time out was performed prior to the initiation of the procedure. Maximal barrier sterile technique utilized including caps, mask, sterile gowns, sterile gloves, large sterile drape, hand hygiene, and Betadine prep. The right groin was prepped and draped in a sterile fashion. 1% lidocaine was utilized for local anesthesia. Under sonographic guidance, a micropuncture needle was inserted into the right common femoral artery. Ultrasound documentation was obtained. The vessel was noted to be patent. This was up sized to a Bentson. A 5 French sheath was inserted. A cobra 2 catheter was advanced into the aorta. The Cobra was advanced over the Bentson wire into the celiac axis.  Angiography was performed. The catheter was then advanced into the splenic artery. Arteriography was performed. Delayed images demonstrate patency  of the portal vein. The catheter was retracted then advanced over a glidewire into the common hepatic artery. Angiography was performed. It was then advanced into the right hepatic artery. Subsequent arteriography was performed. A Renegade high flow catheter was then advanced through the Cobra 2 catheter and into the right hepatic arterial circulation. Four branches of the right hepatic artery were selected and embolized with a combination of 500-700 micron embospheres and Gel-Foam slurry. The microcatheter was removed and final imaging through the Cobra 2 catheter was performed. The Cobra catheter was retracted to the right external iliac artery and femoral angiography was performed. The catheter was removed with the sheath left in place. An ExoSeal device was deployed for hemostasis. FINDINGS: Celiac arteriography delineates anatomy. Splenic arteriography with delayed images demonstrate patency of the splenic and portal veins. Hepatic arterial injection delineates anatomy throughout the liver. There is a large filling defect within the right lobe. Within the filling defect, there are abnormal vessels and pooling of contrast. These areas are worrisome for pseudoaneurysm formation or possibly active contrast extravasation. It may simply represent foci of tumor blush. These areas correspond to the abnormal mass with hemorrhage on the accompanying MRI of the abdomen. After embolization, the abnormal vasculature was no longer filling. A wedge-shaped defect within the inferior right lobe of the liver was identified. This is consistent with successful embolization of the abnormal right lobe lesion. 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 A combination of embospheres and Gel-Foam slurry was utilized. Electronically Signed   By: Marybelle Killings M.D.   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 A total of Versed 3 mg and Fentanyl 100 mcg was administered intravenously. Moderate Sedation Time: 70 minutes. The patient's level of consciousness and vital signs were monitored continuously by radiology nursing throughout the procedure under my direct supervision. CONTRAST:  150 cc Isovue-300 FLUOROSCOPY TIME:  Fluoroscopy Time: 20 minutes 12 seconds (1,190 mGy). COMPLICATIONS: None immediate. PROCEDURE: Informed consent was obtained from the patient following explanation of the procedure, risks, benefits and alternatives. The patient understands, agrees and consents for the procedure. All questions were addressed. A time out was performed prior to the initiation of the procedure. Maximal barrier sterile technique utilized including caps, mask, sterile gowns, sterile gloves, large sterile drape, hand hygiene, and Betadine prep. The right groin was prepped and draped in a sterile fashion. 1% lidocaine was utilized for local anesthesia. Under sonographic guidance, a micropuncture needle was inserted into the right common femoral artery. Ultrasound documentation was obtained. The vessel was noted to be patent. This was up sized to a Bentson. A 5 French sheath was inserted. A cobra 2 catheter was advanced into the aorta. The Cobra was advanced over the Bentson wire into the celiac axis. Angiography was performed. The catheter was then advanced into the splenic artery. Arteriography was performed. Delayed images demonstrate patency of the portal vein. The catheter was retracted then advanced over a glidewire into the common hepatic artery. Angiography was performed. It was then advanced into the right hepatic artery. Subsequent arteriography was  performed. A Renegade high flow catheter was then advanced through the Cobra 2  catheter and into the right hepatic arterial circulation. Four branches of the right hepatic artery were selected and embolized with a combination of 500-700 micron embospheres and Gel-Foam slurry. The microcatheter was removed and final imaging through the Cobra 2 catheter was performed. The Cobra catheter was retracted to the right external iliac artery and femoral angiography was performed. The catheter was removed with the sheath left in place. An ExoSeal device was deployed for hemostasis. FINDINGS: Celiac arteriography delineates anatomy. Splenic arteriography with delayed images demonstrate patency of the splenic and portal veins. Hepatic arterial injection delineates anatomy throughout the liver. There is a large filling defect within the right lobe. Within the filling defect, there are abnormal vessels and pooling of contrast. These areas are worrisome for pseudoaneurysm formation or possibly active contrast extravasation. It may simply represent foci of tumor blush. These areas correspond to the abnormal mass with hemorrhage on the accompanying MRI of the abdomen. After embolization, the abnormal vasculature was no longer filling. A wedge-shaped defect within the inferior right lobe of the liver was identified. This is consistent with successful embolization of the abnormal right lobe lesion. 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 A combination of embospheres and Gel-Foam slurry was utilized. Electronically Signed   By: Marybelle Killings M.D.   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 A total of Versed 3 mg and Fentanyl 100 mcg was administered intravenously. Moderate Sedation Time: 70 minutes. The patient's level of consciousness and vital signs were monitored continuously by radiology nursing throughout the procedure under my direct supervision. CONTRAST:  150 cc Isovue-300 FLUOROSCOPY TIME:  Fluoroscopy Time: 20 minutes 12 seconds (1,190 mGy). COMPLICATIONS: None immediate. PROCEDURE: Informed consent was obtained from the patient following explanation of the procedure, risks, benefits and alternatives. The patient understands, agrees and consents for the procedure. All questions were addressed. A time out was performed prior to the initiation of the procedure. Maximal barrier sterile technique utilized including caps, mask, sterile gowns, sterile gloves, large sterile drape, hand hygiene, and Betadine prep. The right groin was prepped and draped in a sterile fashion. 1% lidocaine was utilized for local anesthesia. Under sonographic guidance, a micropuncture needle was inserted into the right common femoral artery. Ultrasound documentation was obtained. The vessel was noted to be patent. This was up sized to a Bentson. A 5 French sheath was inserted. A cobra 2 catheter was advanced into the aorta. The Cobra was advanced over the Bentson wire into the celiac axis. Angiography was performed. The catheter was then advanced into the splenic artery. Arteriography was performed. Delayed images demonstrate patency of the portal vein. The catheter was retracted then advanced over a glidewire into the common hepatic artery. Angiography was performed. It was then advanced into the right hepatic artery. Subsequent arteriography was performed. A Renegade high flow catheter was then advanced through the Cobra 2 catheter and into the right hepatic arterial circulation. Four branches of the right hepatic artery were selected and embolized with a combination of  500-700 micron embospheres and Gel-Foam slurry. The microcatheter was removed and final imaging through the Cobra 2 catheter was performed. The Cobra catheter was retracted to the right external iliac artery and femoral angiography was performed. The catheter was removed with the sheath  left in place. An ExoSeal device was deployed for hemostasis. FINDINGS: Celiac arteriography delineates anatomy. Splenic arteriography with delayed images demonstrate patency of the splenic and portal veins. Hepatic arterial injection delineates anatomy throughout the liver. There is a large filling defect within the right lobe. Within the filling defect, there are abnormal vessels and pooling of contrast. These areas are worrisome for pseudoaneurysm formation or possibly active contrast extravasation. It may simply represent foci of tumor blush. These areas correspond to the abnormal mass with hemorrhage on the accompanying MRI of the abdomen. After embolization, the abnormal vasculature was no longer filling. A wedge-shaped defect within the inferior right lobe of the liver was identified. This is consistent with successful embolization of the abnormal right lobe lesion. 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 A combination of embospheres and Gel-Foam slurry was utilized. Electronically Signed   By: Marybelle Killings M.D.   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 A total of Versed 3 mg and Fentanyl 100 mcg was administered intravenously. Moderate Sedation Time: 70 minutes. The patient's level of consciousness and vital signs were monitored  continuously by radiology nursing throughout the procedure under my direct supervision. CONTRAST:  150 cc Isovue-300 FLUOROSCOPY TIME:  Fluoroscopy Time: 20 minutes 12 seconds (1,190 mGy). COMPLICATIONS: None immediate. PROCEDURE: Informed consent was obtained from the patient following explanation of the procedure, risks, benefits and alternatives. The patient understands, agrees and consents for the procedure. All questions were addressed. A time out was performed prior to the initiation of the procedure. Maximal barrier sterile technique utilized including caps, mask, sterile gowns, sterile gloves, large sterile drape, hand hygiene, and Betadine prep. The right groin was prepped and draped in a sterile fashion. 1% lidocaine was utilized for local anesthesia. Under sonographic guidance, a micropuncture needle was inserted into the right common femoral artery. Ultrasound documentation was obtained. The vessel was noted to be patent. This was up sized to a Bentson. A 5 French sheath was inserted. A cobra 2 catheter was advanced into the aorta. The Cobra was advanced over the Bentson wire into the celiac axis. Angiography was performed. The catheter was then advanced into the splenic artery. Arteriography was performed. Delayed images demonstrate patency of the portal vein. The catheter was retracted then advanced over a glidewire into the common hepatic artery. Angiography was performed. It was then advanced into the right hepatic artery. Subsequent arteriography was performed. A Renegade high flow catheter was then advanced through the Cobra 2 catheter and into the right hepatic arterial circulation. Four branches of the right hepatic artery were selected and embolized with a combination of 500-700 micron embospheres and Gel-Foam slurry. The microcatheter was removed and final imaging through the Cobra 2 catheter was performed. The Cobra catheter was retracted to the right external iliac artery and femoral  angiography was performed. The catheter was removed with the sheath left in place. An ExoSeal device was deployed for hemostasis. FINDINGS: Celiac arteriography delineates anatomy. Splenic arteriography with delayed images demonstrate patency of the splenic and portal veins. Hepatic arterial injection delineates anatomy throughout the liver. There is a large filling defect within the right lobe. Within the filling defect, there are abnormal vessels and pooling of contrast. These areas are worrisome for pseudoaneurysm formation or  possibly active contrast extravasation. It may simply represent foci of tumor blush. These areas correspond to the abnormal mass with hemorrhage on the accompanying MRI of the abdomen. After embolization, the abnormal vasculature was no longer filling. A wedge-shaped defect within the inferior right lobe of the liver was identified. This is consistent with successful embolization of the abnormal right lobe lesion. 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 A combination of embospheres and Gel-Foam slurry was utilized. Electronically Signed   By: Marybelle Killings M.D.   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 A total of Versed 3 mg and Fentanyl 100 mcg was administered intravenously. Moderate Sedation Time: 70 minutes. The patient's level of consciousness and vital signs were monitored continuously by radiology nursing throughout the procedure under my direct supervision. CONTRAST:  150 cc Isovue-300 FLUOROSCOPY TIME:  Fluoroscopy Time: 20 minutes 12 seconds (1,190 mGy). COMPLICATIONS: None immediate. PROCEDURE:  Informed consent was obtained from the patient following explanation of the procedure, risks, benefits and alternatives. The patient understands, agrees and consents for the procedure. All questions were addressed. A time out was performed prior to the initiation of the procedure. Maximal barrier sterile technique utilized including caps, mask, sterile gowns, sterile gloves, large sterile drape, hand hygiene, and Betadine prep. The right groin was prepped and draped in a sterile fashion. 1% lidocaine was utilized for local anesthesia. Under sonographic guidance, a micropuncture needle was inserted into the right common femoral artery. Ultrasound documentation was obtained. The vessel was noted to be patent. This was up sized to a Bentson. A 5 French sheath was inserted. A cobra 2 catheter was advanced into the aorta. The Cobra was advanced over the Bentson wire into the celiac axis. Angiography was performed. The catheter was then advanced into the splenic artery. Arteriography was performed. Delayed images demonstrate patency of the portal vein. The catheter was retracted then advanced over a glidewire into the common hepatic artery. Angiography was performed. It was then advanced into the right hepatic artery. Subsequent arteriography was performed. A Renegade high flow catheter was then advanced through the Cobra 2 catheter and into the right hepatic arterial circulation. Four branches of the right hepatic artery were selected and embolized with a combination of 500-700 micron embospheres and Gel-Foam slurry. The microcatheter was removed and final imaging through the Cobra 2 catheter was performed. The Cobra catheter was retracted to the right external iliac artery and femoral angiography was performed. The catheter was removed with the sheath left in place. An ExoSeal device was deployed for hemostasis. FINDINGS: Celiac arteriography delineates anatomy. Splenic arteriography with delayed images demonstrate  patency of the splenic and portal veins. Hepatic arterial injection delineates anatomy throughout the liver. There is a large filling defect within the right lobe. Within the filling defect, there are abnormal vessels and pooling of contrast. These areas are worrisome for pseudoaneurysm formation or possibly active contrast extravasation. It may simply represent foci of tumor blush. These areas correspond to the abnormal mass with hemorrhage on the accompanying MRI of the abdomen. After embolization, the abnormal vasculature was no longer filling. A wedge-shaped defect within the inferior right lobe of the liver was identified. This is consistent with successful embolization of the abnormal right lobe lesion. IMPRESSION: Successful hepatic arterial  embolization of a hemorrhagic right hepatic lobe mass. A combination of embospheres and Gel-Foam slurry was utilized. Electronically Signed   By: Marybelle Killings M.D.   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 A total of Versed 3 mg and Fentanyl 100 mcg was administered intravenously. Moderate Sedation Time: 70 minutes. The patient's level of consciousness and vital signs were monitored continuously by radiology nursing throughout the procedure under my direct supervision. CONTRAST:  150 cc Isovue-300 FLUOROSCOPY TIME:  Fluoroscopy Time: 20 minutes 12 seconds (1,190 mGy). COMPLICATIONS: None immediate. PROCEDURE: Informed consent was obtained from the patient following explanation of the procedure, risks, benefits and alternatives. The patient understands, agrees and consents for the procedure. All questions were addressed. A time out was  performed prior to the initiation of the procedure. Maximal barrier sterile technique utilized including caps, mask, sterile gowns, sterile gloves, large sterile drape, hand hygiene, and Betadine prep. The right groin was prepped and draped in a sterile fashion. 1% lidocaine was utilized for local anesthesia. Under sonographic guidance, a micropuncture needle was inserted into the right common femoral artery. Ultrasound documentation was obtained. The vessel was noted to be patent. This was up sized to a Bentson. A 5 French sheath was inserted. A cobra 2 catheter was advanced into the aorta. The Cobra was advanced over the Bentson wire into the celiac axis. Angiography was performed. The catheter was then advanced into the splenic artery. Arteriography was performed. Delayed images demonstrate patency of the portal vein. The catheter was retracted then advanced over a glidewire into the common hepatic artery. Angiography was performed. It was then advanced into the right hepatic artery. Subsequent arteriography was performed. A Renegade high flow catheter was then advanced through the Cobra 2 catheter and into the right hepatic arterial circulation. Four branches of the right hepatic artery were selected and embolized with a combination of 500-700 micron embospheres and Gel-Foam slurry. The microcatheter was removed and final imaging through the Cobra 2 catheter was performed. The Cobra catheter was retracted to the right external iliac artery and femoral angiography was performed. The catheter was removed with the sheath left in place. An ExoSeal device was deployed for hemostasis. FINDINGS: Celiac arteriography delineates anatomy. Splenic arteriography with delayed images demonstrate patency of the splenic and portal veins. Hepatic arterial injection delineates anatomy throughout the liver. There is a large filling defect within the right lobe. Within the filling defect, there are abnormal vessels and pooling of  contrast. These areas are worrisome for pseudoaneurysm formation or possibly active contrast extravasation. It may simply represent foci of tumor blush. These areas correspond to the abnormal mass with hemorrhage on the accompanying MRI of the abdomen. After embolization, the abnormal vasculature was no longer filling. A wedge-shaped defect within the inferior right lobe of the liver was identified. This is consistent with successful embolization of the abnormal right lobe lesion. 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 A combination of embospheres and Gel-Foam slurry was utilized. Electronically Signed   By: Marybelle Killings M.D.   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 A total of Versed 3 mg and Fentanyl 100 mcg was administered intravenously. Moderate Sedation Time: 70 minutes. The patient's level of consciousness and vital signs were monitored continuously by radiology nursing throughout the procedure under my direct supervision. CONTRAST:  150 cc Isovue-300 FLUOROSCOPY TIME:  Fluoroscopy Time: 20 minutes 12 seconds (1,190 mGy). COMPLICATIONS: None immediate. PROCEDURE: Informed consent was obtained from the patient following explanation of the procedure, risks, benefits and alternatives. The patient understands, agrees and consents for the procedure. All questions were addressed. A time out was performed prior to the initiation of the procedure. Maximal barrier sterile technique utilized including caps, mask, sterile gowns, sterile gloves, large sterile drape, hand hygiene, and Betadine prep. The right groin was prepped and  draped in a sterile fashion. 1% lidocaine was utilized for local anesthesia. Under sonographic guidance, a micropuncture needle was inserted into the right common femoral artery. Ultrasound documentation was obtained. The vessel was noted to be patent. This was up sized to a Bentson. A 5 French sheath was inserted. A cobra 2 catheter was advanced into the aorta. The Cobra was advanced over the Bentson wire into the celiac axis. Angiography was performed. The catheter was then advanced into the splenic artery. Arteriography was performed. Delayed images demonstrate patency of the portal vein. The catheter was retracted then advanced over a glidewire into the common hepatic artery. Angiography was performed. It was then advanced into the right hepatic artery. Subsequent arteriography was performed. A Renegade high flow catheter was then advanced through the Cobra 2 catheter and into the right hepatic arterial circulation. Four branches of the right hepatic artery were selected and embolized with a combination of 500-700 micron embospheres and Gel-Foam slurry. The microcatheter was removed and final imaging through the Cobra 2 catheter was performed. The Cobra catheter was retracted to the right external iliac artery and femoral angiography was performed. The catheter was removed with the sheath left in place. An ExoSeal device was deployed for hemostasis. FINDINGS: Celiac arteriography delineates anatomy. Splenic arteriography with delayed images demonstrate patency of the splenic and portal veins. Hepatic arterial injection delineates anatomy throughout the liver. There is a large filling defect within the right lobe. Within the filling defect, there are abnormal vessels and pooling of contrast. These areas are worrisome for pseudoaneurysm formation or possibly active contrast extravasation. It may simply represent foci of tumor blush. These areas correspond to the abnormal mass with hemorrhage on the accompanying  MRI of the abdomen. After embolization, the abnormal vasculature was no longer filling. A wedge-shaped defect within the inferior right lobe of the liver was identified. This is consistent with successful embolization of the abnormal right lobe lesion. 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 A combination of embospheres and Gel-Foam slurry was utilized. Electronically Signed   By: Marybelle Killings M.D.   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 A total of Versed 3 mg and Fentanyl 100 mcg was administered intravenously. Moderate Sedation Time: 70 minutes. The patient's level of consciousness and vital signs were monitored continuously by radiology nursing throughout the procedure under my direct supervision. CONTRAST:  150 cc Isovue-300 FLUOROSCOPY TIME:  Fluoroscopy Time: 20 minutes 12 seconds (1,190 mGy). COMPLICATIONS: None immediate. PROCEDURE: Informed  consent was obtained from the patient following explanation of the procedure, risks, benefits and alternatives. The patient understands, agrees and consents for the procedure. All questions were addressed. A time out was performed prior to the initiation of the procedure. Maximal barrier sterile technique utilized including caps, mask, sterile gowns, sterile gloves, large sterile drape, hand hygiene, and Betadine prep. The right groin was prepped and draped in a sterile fashion. 1% lidocaine was utilized for local anesthesia. Under sonographic guidance, a micropuncture needle was inserted into the right common femoral artery. Ultrasound documentation was obtained. The vessel was noted to be patent.  This was up sized to a Bentson. A 5 French sheath was inserted. A cobra 2 catheter was advanced into the aorta. The Cobra was advanced over the Bentson wire into the celiac axis. Angiography was performed. The catheter was then advanced into the splenic artery. Arteriography was performed. Delayed images demonstrate patency of the portal vein. The catheter was retracted then advanced over a glidewire into the common hepatic artery. Angiography was performed. It was then advanced into the right hepatic artery. Subsequent arteriography was performed. A Renegade high flow catheter was then advanced through the Cobra 2 catheter and into the right hepatic arterial circulation. Four branches of the right hepatic artery were selected and embolized with a combination of 500-700 micron embospheres and Gel-Foam slurry. The microcatheter was removed and final imaging through the Cobra 2 catheter was performed. The Cobra catheter was retracted to the right external iliac artery and femoral angiography was performed. The catheter was removed with the sheath left in place. An ExoSeal device was deployed for hemostasis. FINDINGS: Celiac arteriography delineates anatomy. Splenic arteriography with delayed images demonstrate patency of the splenic and portal veins. Hepatic arterial injection delineates anatomy throughout the liver. There is a large filling defect within the right lobe. Within the filling defect, there are abnormal vessels and pooling of contrast. These areas are worrisome for pseudoaneurysm formation or possibly active contrast extravasation. It may simply represent foci of tumor blush. These areas correspond to the abnormal mass with hemorrhage on the accompanying MRI of the abdomen. After embolization, the abnormal vasculature was no longer filling. A wedge-shaped defect within the inferior right lobe of the liver was identified. This is consistent with successful embolization of the abnormal right lobe lesion.  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 A combination of embospheres and Gel-Foam slurry was utilized. Electronically Signed   By: Marybelle Killings M.D.   On: 04/28/2018 09:01   Ir Embo Art  [place] Guide Roadmapping  Addendum Date: 04/28/2018   ADDENDUM REPORT: 04/28/2018 10:56 ADDENDUM: There were form vessels in the right hepatic arterial tree that were selected. These are all fifth order. Electronically Signed   By: Marybelle Killings M.D.   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 A total of Versed 3 mg and Fentanyl 100 mcg was administered intravenously. Moderate Sedation Time: 70 minutes. The patient's level of consciousness and vital signs were monitored continuously by radiology nursing throughout the procedure under my direct supervision. CONTRAST:  150 cc Isovue-300 FLUOROSCOPY TIME:  Fluoroscopy Time: 20 minutes 12 seconds (1,190 mGy). COMPLICATIONS: None immediate. PROCEDURE: Informed consent was obtained from the patient following explanation of the procedure, risks, benefits and alternatives. The  patient understands, agrees and consents for the procedure. All questions were addressed. A time out was performed prior to the initiation of the procedure. Maximal barrier sterile technique utilized including caps, mask, sterile gowns, sterile gloves, large sterile drape, hand hygiene, and Betadine prep. The right groin was prepped and draped in a sterile fashion. 1% lidocaine was utilized for local anesthesia. Under sonographic guidance, a micropuncture needle was inserted into the right common femoral artery. Ultrasound documentation was obtained. The  vessel was noted to be patent. This was up sized to a Bentson. A 5 French sheath was inserted. A cobra 2 catheter was advanced into the aorta. The Cobra was advanced over the Bentson wire into the celiac axis. Angiography was performed. The catheter was then advanced into the splenic artery. Arteriography was performed. Delayed images demonstrate patency of the portal vein. The catheter was retracted then advanced over a glidewire into the common hepatic artery. Angiography was performed. It was then advanced into the right hepatic artery. Subsequent arteriography was performed. A Renegade high flow catheter was then advanced through the Cobra 2 catheter and into the right hepatic arterial circulation. Four branches of the right hepatic artery were selected and embolized with a combination of 500-700 micron embospheres and Gel-Foam slurry. The microcatheter was removed and final imaging through the Cobra 2 catheter was performed. The Cobra catheter was retracted to the right external iliac artery and femoral angiography was performed. The catheter was removed with the sheath left in place. An ExoSeal device was deployed for hemostasis. FINDINGS: Celiac arteriography delineates anatomy. Splenic arteriography with delayed images demonstrate patency of the splenic and portal veins. Hepatic arterial injection delineates anatomy throughout the liver. There is a large filling defect within the right lobe. Within the filling defect, there are abnormal vessels and pooling of contrast. These areas are worrisome for pseudoaneurysm formation or possibly active contrast extravasation. It may simply represent foci of tumor blush. These areas correspond to the abnormal mass with hemorrhage on the accompanying MRI of the abdomen. After embolization, the abnormal vasculature was no longer filling. A wedge-shaped defect within the inferior right lobe of the liver was identified. This is consistent with successful embolization of the  abnormal right lobe lesion. 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 A combination of embospheres and Gel-Foam slurry was utilized. Electronically Signed: By: Marybelle Killings M.D. On: 04/28/2018 09:03    Anti-infectives: Anti-infectives (From admission, onward)   Start     Dose/Rate Route Frequency Ordered Stop   04/27/18 1800  azithromycin (ZITHROMAX) 500 mg in sodium chloride 0.9 % 250 mL IVPB     500 mg 250 mL/hr over 60 Minutes Intravenous Every 24 hours 04/27/18 1647     04/27/18 1800  cefTRIAXone (ROCEPHIN) 1 g in sodium chloride 0.9 % 100 mL IVPB     1 g 200 mL/hr over 30 Minutes Intravenous Every 24 hours 04/27/18 1657         Assessment/Plan Acute blood loss anemia - stable Jehovah witness Obese  9 x8 x 6 Large right hepatic mass with hemorrhage - probably adenoma - CT scan 5/5 showed large 8.5 cm hypodense indeterminate posterior right liver lobe mass, which is directly contiguous with small to moderate volume posterior right perihepatic acute hemoperitoneum - S/p R lobe adenoma embolization 5/8 in IR - Hg 8.8, stable -some post embolization symptoms such as fever and general malaise.  This can be expected for the first 72ish hours.  ID -  azithromycin 5/8>>, rocephin 5/8>> FEN - regular diet VTE - SCDs Foley - none Follow up - [facility] to discuss surgical intervention or resection   Plan - S/p TAE, hgb stable.  No plans for surgical intervention.  She will need follow up at a tertiary care facility to discuss resection down the road.  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  Surgery will sign off.      LOS: 5 days    Henreitta Cea , [facility] 04/29/2018, 11:35 AM Pager: [phone]

## 2018-04-30 DIAGNOSIS — D134 Benign neoplasm of liver: Principal | ICD-10-CM

## 2018-04-30 DIAGNOSIS — J189 Pneumonia, unspecified organism: Secondary | ICD-10-CM | POA: Diagnosis present

## 2018-04-30 LAB — CBC
HEMATOCRIT: 24.4 % — AB (ref 36.0–46.0)
Hemoglobin: 7.9 g/dL — ABNORMAL LOW (ref 12.0–15.0)
MCH: 29.4 pg (ref 26.0–34.0)
MCHC: 32.4 g/dL (ref 30.0–36.0)
MCV: 90.7 fL (ref 78.0–100.0)
PLATELETS: 399 10*3/uL (ref 150–400)
RBC: 2.69 MIL/uL — ABNORMAL LOW (ref 3.87–5.11)
RDW: 14.5 % (ref 11.5–15.5)
WBC: 13.3 10*3/uL — AB (ref 4.0–10.5)

## 2018-04-30 LAB — CULTURE, BLOOD (ROUTINE X 2)
CULTURE: NO GROWTH
Culture: NO GROWTH
SPECIAL REQUESTS: ADEQUATE
Special Requests: ADEQUATE

## 2018-04-30 MED ORDER — BOOST / RESOURCE BREEZE PO LIQD CUSTOM
1.0000 | Freq: Three times a day (TID) | ORAL | Status: DC
  Administered 2018-04-30 – 2018-05-01 (×4): 1 via ORAL

## 2018-04-30 MED ORDER — DIPHENHYDRAMINE HCL 25 MG PO CAPS
25.0000 mg | ORAL_CAPSULE | Freq: Every day | ORAL | Status: DC
Start: 2018-04-30 — End: 2018-05-01
  Administered 2018-04-30: 25 mg via ORAL
  Filled 2018-04-30: qty 1

## 2018-04-30 MED ORDER — POLYETHYLENE GLYCOL 3350 17 G PO PACK
17.0000 g | PACK | Freq: Every day | ORAL | Status: DC
  Administered 2018-04-30: 17 g via ORAL
  Filled 2018-04-30: qty 1

## 2018-04-30 MED ORDER — SODIUM CHLORIDE 0.9 % IV SOLN
510.0000 mg | Freq: Once | INTRAVENOUS | Status: AC
  Administered 2018-04-30: 510 mg via INTRAVENOUS
  Filled 2018-04-30: qty 17

## 2018-04-30 MED ORDER — DARBEPOETIN ALFA 200 MCG/0.4ML IJ SOSY: 200.0000 ug | PREFILLED_SYRINGE | Freq: Once | INTRAMUSCULAR | Status: DC

## 2018-04-30 NOTE — Evaluation (Signed)
Occupational Therapy Evaluation Patient Details Name: Latoya Kramer MRN: [id number] DOB: Nov 01, 1991 Today's Date: 04/30/2018    History of Present Illness 27 y.o. Latoya Kramer Witness with a hx of allergy, anemia, HTN and obesity who presented to the ED with abdominal pain.  CT abdom noted a posterior right liver lobe mass with moderate volume perihepatic acute hemoperitoneum.     Clinical Impression    Pt admitted for above. Pt independent with ADLs, PTA. Feel pt will benefit from acute OT to increase independence prior to d/c. Recommending HHOT at this time.     Follow Up Recommendations  Home health OT;Supervision/Assistance - 24 hour    Equipment Recommendations  Other (comment);3 in 1 bedside commode(RW with two wheels)    Recommendations for Other Services       Precautions / Restrictions Precautions Precautions: Fall Restrictions Weight Bearing Restrictions: No      Mobility Bed Mobility Overal bed mobility: Needs Assistance Bed Mobility: Supine to Sit     Supine to sit: [facility] elevated;Min assist     General bed mobility comments: assist with trunk. Explained log rolling would be good to learn as her bed at home is flat.   Transfers Overall transfer level: Needs assistance Equipment used: Rolling walker (2 wheeled) Transfers: Sit to/from Stand Sit to Stand: Supervision(and set up for walker. )         General transfer comment: Cue for hand placement.     Balance    Used RW for support for ambulation.                                       ADL either performed or assessed with clinical judgement   ADL Overall ADL's : Needs assistance/impaired     Grooming: Wash/dry face;Wash/dry hands;Standing;Min guard;Sitting               Lower Body Dressing: Minimal assistance;Sit to/from stand   Toilet Transfer: Nature conservation officer;Ambulation;RW   Toileting- Clothing Manipulation and Hygiene: Sit to/from stand;Moderate  assistance Toileting - Clothing Manipulation Details (indicate cue type and reason): pt able to manage hygiene after urinating and OT assisted with gown; pt asked nurse for help wiping after BM     Functional mobility during ADLs: Min guard;Rolling walker General ADL Comments: Pt became out of breath in session.      Vision         Perception     Praxis      Pertinent Vitals/Pain Pain Assessment: 0-10 Pain Score: (4-5) Pain Location: Right side/flank Pain Descriptors / Indicators: Sore Pain Intervention(s): Monitored during session     Hand Dominance     Extremity/Trunk Assessment Upper Extremity Assessment Upper Extremity Assessment: Overall WFL for tasks assessed   Lower Extremity Assessment Lower Extremity Assessment: Defer to PT evaluation       Communication Communication Communication: No difficulties   Cognition Arousal/Alertness: Awake/alert Behavior During Therapy: WFL for tasks assessed/performed Overall Cognitive Status: Within Functional Limits for tasks assessed                                     General Comments       Exercises     Shoulder Instructions      Home Living Family/patient expects to be discharged to:: Private residence Living Arrangements: Alone(can stay with mom or  grandma) Available Help at Discharge: Family;Available 24 hours/day Type of Home: Apartment Home Access: Stairs to enter Entrance Stairs-Number of Steps: 10 Entrance Stairs-Rails: Left Home Layout: One level     Bathroom Shower/Tub: Teacher, early years/pre: Standard                Prior Functioning/Environment Level of Independence: Independent                 OT Problem List: Decreased strength;Impaired balance (sitting and/or standing);Decreased activity tolerance;Decreased knowledge of use of DME or AE;Decreased knowledge of precautions;Pain;Obesity      OT Treatment/Interventions: Self-care/ADL training;Energy  conservation;DME and/or AE instruction;Balance training;Patient/family education;Therapeutic activities    OT Goals(Current goals can be found in the care plan section) Acute Rehab OT Goals Patient Stated Goal: not stated OT Goal Formulation: With patient Time For Goal Achievement: 05/07/18 Potential to Achieve Goals: Good ADL Goals Pt Will Perform Lower Body Bathing: with set-up;with supervision;sit to/from stand Pt Will Perform Lower Body Dressing: with set-up;with supervision;sit to/from stand Pt Will Transfer to Toilet: with supervision;ambulating;regular height toilet Pt Will Perform Toileting - Clothing Manipulation and hygiene: with supervision;sit to/from stand Additional ADL Goal #1: Pt will perform bed mobility (log roll technique) with modified independence.  OT Frequency: Min 2X/week   Barriers to D/C:            Co-evaluation              AM-PAC PT "6 Clicks" Daily Activity     Outcome Measure Help from another person eating meals?: None Help from another person taking care of personal grooming?: A Little Help from another person toileting, which includes using toliet, bedpan, or urinal?: A Little Help from another person bathing (including washing, rinsing, drying)?: A Lot Help from another person to put on and taking off regular upper body clothing?: A Little Help from another person to put on and taking off regular lower body clothing?: A Little 6 Click Score: 18   End of Session Equipment Utilized During Treatment: Gait belt;Rolling walker;Oxygen Nurse Communication: Other (comment)(pt left in chair)  Activity Tolerance: Patient tolerated treatment well Patient left: in chair;with call bell/phone within reach;with family/visitor present  OT Visit Diagnosis: Pain Pain - Right/Left: Right Pain - part of body: (right side/flank area)                Time: 6378-5885 OT Time Calculation (min): 19 min Charges:  OT General Charges $OT Visit: 1 Visit OT  Evaluation $OT Eval Moderate Complexity: 1 Mod G-Codes:      Latoya Kramer L Keelen Quevedo OTR/L 04/30/2018, 10:18 AM

## 2018-04-30 NOTE — Progress Notes (Signed)
[facility]                                                                              Patient Demographics  Latoya Kramer, is a 27 y.o. female, DOB - November 30, 1991, UDT:[id number]  Admit date - 04/24/2018   Admitting Physician Cristal Ford, DO  Outpatient Primary MD for the patient is Patient, No Pcp Per  Outpatient specialists:   LOS - 6  days   Medical records reviewed and are as summarized below:    Chief Complaint  Patient presents with  . Abdominal Pain       Brief summary   27yo F Jehovah's Witness w/ a hx of HTN and obesity who presented to the ED w/ abdominal pain. CT abdom noted a posterior right liver lobe mass with moderate volume perihepatic acute hemoperitoneum.    Assessment & Plan    Principal Problem:   Hemoperitoneum, nontraumatic -Patient had presented with abdominal pain, CT abdomen 5/5 showed large 8.5 cm hypodense indeterminate posterior right liver lobe mass, directly contiguous with small to moderate posterior right perihepatic acute hemoperitoneum.  Patient met Sirs criteria -General surgery was consulted and recommended interventional radiology.  Patient is Sales promotion account executive Witness. -Patient underwent IR embolization on 5/8 -Patient and mother aware that she will need tertiary care to discuss resection  Active Problems:   Anemia: Due to intra-abdominal bleeding as #1, Jehovah's Witness -Patient will not accept blood transfusions, hemoglobin 7.9 today -Discussed with patient and mother, gave option for H&H tomorrow morning to assess if it is trending down, will give 1 dose of IV Feraheme and epo - If in H&H trending down tomorrow, will repeat CT abdomen    Hypertension  - BP currently stable, on beta-blocker    Obesity -Recommended diet and weight control  Fever/pneumonia -Patient was noticed to have fever on 5/7, afebrile for last 24 hours.  Patient had reported recent bronchitis, chest x-ray showed possible left basilar  pneumonia -Patient was placed on IV antibiotics, WBC trending up, continue IV Rocephin -Follow blood cultures, UA negative  Code Status: Full CODE STATUS DVT Prophylaxis:  SCD's Family Communication: Discussed in detail with the patient, all imaging results, lab results explained to the patient and mother   Disposition Plan: Once H&H stable and cleared from IR  Time Spent in minutes 25 minutes   Procedures:  IR embolization  Consultants:   IR General surgery  Antimicrobials:   IV Rocephin   Medications  Scheduled Meds: . [START ON 05/03/2018] Darbepoetin Alfa  200 mcg Subcutaneous Q Tue-1800  . [START ON 05/03/2018] darbepoetin (ARANESP) injection - NON-DIALYSIS  200 mcg Subcutaneous Once  . diphenhydrAMINE  25 mg Oral QHS  . feeding supplement  1 Container Oral TID BM  . folic acid  1 mg Oral Daily  . metoprolol tartrate  50 mg Oral BID  . polyethylene glycol  17 g Oral Daily  . senna-docusate  2 tablet Oral QHS   Continuous Infusions: . azithromycin Stopped (04/29/18 1820)  . cefTRIAXone (ROCEPHIN)  IV Stopped (04/29/18 1820)  . ferumoxytol     PRN Meds:.acetaminophen, ALPRAZolam, butalbital-acetaminophen-caffeine, ondansetron **OR** ondansetron (ZOFRAN) IV, oxyCODONE,  traMADol   Antibiotics   Anti-infectives (From admission, onward)   Start     Dose/Rate Route Frequency Ordered Stop   04/27/18 1800  azithromycin (ZITHROMAX) 500 mg in sodium chloride 0.9 % 250 mL IVPB     500 mg 250 mL/hr over 60 Minutes Intravenous Every 24 hours 04/27/18 1647     04/27/18 1800  cefTRIAXone (ROCEPHIN) 1 g in sodium chloride 0.9 % 100 mL IVPB     1 g 200 mL/hr over 30 Minutes Intravenous Every 24 hours 04/27/18 1657          Subjective:   Jamirra Moncivais was seen and examined today.  Did not sleep well last night otherwise no worsening abdominal pain, dizziness lightheadedness.  Has right lower quadrant mild abdominal discomfort close to groin.  No fevers or chills.  patient denies dizziness, chest pain, shortness of breath, No acute events overnight.    Objective:   Vitals:   04/29/18 0920 04/29/18 1311 04/29/18 2216 04/30/18 0436  BP: (!) 153/93 139/90  128/84  Pulse: (!) 123   (!) 105  Resp: (!) 37 (!) 23  18  Temp: 100 F (37.8 C) 98.5 F (36.9 C)  98.7 F (37.1 C)  TempSrc: Oral Oral  Oral  SpO2:    95%  Weight:   95.3 kg (210 lb)     Intake/Output Summary (Last 24 hours) at 04/30/2018 0933 Last data filed at 04/29/2018 1500 Gross per 24 hour  Intake 760 ml  Output -  Net 760 ml     Wt Readings from Last 3 Encounters:  04/29/18 95.3 kg (210 lb)  01/30/14 98.9 kg (218 lb)     Exam  General: Alert and oriented x 3, NAD  Eyes:   HEENT:  Atraumatic, normocephalic, normal oropharynx  Cardiovascular: S1 S2 auscultated, Regular rate and rhythm.  Respiratory: Clear to auscultation bilaterally, no wheezing, rales or rhonchi  Gastrointestinal: Soft, nontender, nondistended, + bowel sounds  Ext: no pedal edema bilaterally, right groin, no hematoma or bleeding.  Neuro: AAOx3, Strength 5/5 upper and lower extremities bilaterally  Musculoskeletal: No digital cyanosis, clubbing  Skin: No rashes  Psych: Normal affect and demeanor, alert and oriented x3    Data Reviewed:  I have personally reviewed following labs and imaging studies  Micro Results Recent Results (from the past 240 hour(s))  Culture, blood (routine x 2)     Status: None (Preliminary result)   Collection Time: 04/25/18  5:45 PM  Result Value Ref Range Status   Specimen Description BLOOD RIGHT ANTECUBITAL  Final   Special Requests   Final    BOTTLES DRAWN AEROBIC AND ANAEROBIC Blood Culture adequate volume   Culture   Final    NO GROWTH 4 DAYS Performed at [facility], [address], [place], [place] [address]    Report Status PENDING  Incomplete  Culture, blood (routine x 2)     Status: None (Preliminary result)   Collection Time: 04/25/18  5:50  PM  Result Value Ref Range Status   Specimen Description BLOOD RIGHT ANTECUBITAL  Final   Special Requests   Final    BOTTLES DRAWN AEROBIC AND ANAEROBIC Blood Culture adequate volume   Culture   Final    NO GROWTH 4 DAYS Performed at [facility], [place] [address], [place], [place] [address]    Report Status PENDING  Incomplete    Radiology Reports Dg Chest 2 View  Result Date: 04/27/2018 CLINICAL DATA:  Fever.  EXAM: CHEST - 2 VIEW COMPARISON:  None. FINDINGS: Mild cardiomegaly is noted. No pneumothorax is noted. Moderate left basilar opacity is noted concerning for pneumonia or atelectasis with associated pleural effusion. Mild right basilar atelectasis is noted. Bony thorax is unremarkable. IMPRESSION: Moderate left basilar opacity is noted concerning for pneumonia or atelectasis with associated pleural effusion. Mild right basilar atelectasis is noted. Electronically Signed   By: Marijo Conception, M.D.   On: 04/27/2018 14:26   Mr Abdomen W Wo Contrast  Result Date: 04/25/2018 CLINICAL DATA:  Liver lesion on CT, nontraumatic hemoperitoneum EXAM: MRI ABDOMEN WITHOUT AND WITH CONTRAST TECHNIQUE: Multiplanar multisequence MR imaging of the abdomen was performed both before and after the administration of intravenous contrast. CONTRAST:  77m MULTIHANCE GADOBENATE DIMEGLUMINE 529 MG/ML IV SOLN COMPARISON:  CT abdomen/pelvis dated 04/24/2018 FINDINGS: Motion degraded images. Lower chest: Mild patchy bilateral lower lobe opacities, left greater than right, likely reflecting atelectasis. Trace bilateral pleural effusions. Hepatobiliary: 9.1 x 7.8 x 5.7 cm mass with peripheral enhancement and intralesional hemorrhage centered in segment 6. Associated perihepatic hemorrhage extending posteriorly outside the liver capsule into the hepatorenal fossa. The lesion itself cannot be easily characterized on MR given acute hemorrhage/rupture. Overall clinical picture favors rupture of a large hepatic adenoma,  although cavernous hemangioma or FNH are also possibilities. Additional 3.7 x 2.4 cm lesion in segment 6 (series 8001/image 21), conspicuous on T2, with avid solid enhancement, which is overall favored to reflect F[place] Dominant 2.9 cm enhancing lesion in segment 4B (series 15001/image 15). Additional scattered 12-13 mm lesions in the right liver (series 15001/images 10 and 16) and in the left hepatic lobe (series 15001/images 28 and 36), not conspicuous on T2 but enhancing, favoring small hepatic adenomas. Gallbladder is within normal limits. No intrahepatic or extrahepatic ductal dilatation. Pancreas:  Within normal limits. Spleen:  Within normal limits. Adrenals/Urinary Tract:  Adrenal glands are within normal limits. Kidneys within normal limits.  No hydronephrosis. Stomach/Bowel: Stomach is within normal limits. Visualized bowel is unremarkable. Vascular/Lymphatic:  No evidence of abdominal aortic aneurysm. No suspicious abdominal lymphadenopathy. Other: Hemorrhage along the hepatorenal fossa (series 5001/image 24), likely related to rupture of the dominant liver lesion. Associated mild ascites in the right upper abdomen. Musculoskeletal: No focal osseous lesions. IMPRESSION: Dominant 9.1 cm mass centered in segment 6 with intralesional hemorrhage/rupture, incompletely characterized in the acute setting, although favoring rupture of a large hepatic adenoma (given the clinical setting) over cavernous hemangioma or FNH. Associated fluid/hemorrhage in the right upper abdomen and along the hepatorenal fossa. Additional 3.7 cm lesion in segment 6 which favors a benign FNH. Additional scattered enhancing lesions measuring up to 2.9 cm in segment 4B, favoring hepatic adenomas, benign. Electronically Signed   By: SJulian HyM.D.   On: 04/25/2018 07:17   UKoreaAbdomen Complete  Result Date: 04/24/2018 CLINICAL DATA:  27year old female with abdominal pain, nausea vomiting. EXAM: ABDOMEN ULTRASOUND COMPLETE  COMPARISON:  Abdominal radiograph dated 04/24/2018 FINDINGS: Evaluation is limited due to patient's body habitus. Gallbladder: No gallstones or wall thickening visualized. No sonographic Murphy sign noted by sonographer. Probable trace free fluid adjacent to the gallbladder. Common bile duct: Diameter: 3 mm Liver: No focal lesion identified. Within normal limits in parenchymal echogenicity. Portal vein is patent on color Doppler imaging with normal direction of blood flow towards the liver. IVC: No abnormality visualized. Pancreas: Poorly visualized. Spleen: Size and appearance within normal limits. Right Kidney: Length: 11.5 cm. The kidneys poorly visualized. No hydronephrosis. Left Kidney: Length:  10.6 cm.  The left kidney is poorly visualized. Abdominal aorta: No aneurysm visualized. Other findings: Small perihepatic free fluid. IMPRESSION: Small perihepatic free fluid. No other abnormality noted within the intention of the exam. Electronically Signed   By: Anner Crete M.D.   On: 04/24/2018 06:35   Ct Abdomen Pelvis W Contrast  Result Date: 04/24/2018 CLINICAL DATA:  Acute right upper quadrant abdominal pain. EXAM: CT ABDOMEN AND PELVIS WITH CONTRAST TECHNIQUE: Multidetector CT imaging of the abdomen and pelvis was performed using the standard protocol following bolus administration of intravenous contrast. CONTRAST:  188m ISOVUE-300 IOPAMIDOL (ISOVUE-300) INJECTION 61% COMPARISON:  Abdominal sonogram from earlier today. FINDINGS: Lower chest: No significant pulmonary nodules or acute consolidative airspace disease. Hepatobiliary: There is an 8.5 x 6.1 cm hypodense posterior right liver lobe mass (series 2/image 27) with irregular outer contour, which is directly contiguous with small to moderate volume posterior right perihepatic acute hyperdense hemoperitoneum. No additional liver masses. Normal gallbladder with no radiopaque cholelithiasis. No biliary ductal dilatation. Pancreas: Normal, with no mass  or duct dilation. Spleen: Normal size. No mass. Adrenals/Urinary Tract: Normal adrenals. Normal kidneys with no hydronephrosis and no renal mass. Normal bladder. Stomach/Bowel: Normal non-distended stomach. Normal caliber small bowel with no small bowel wall thickening. Normal appendix. Normal large bowel with no diverticulosis, large bowel wall thickening or pericolonic fat stranding. Vascular/Lymphatic: Normal caliber abdominal aorta. Patent portal, splenic, hepatic and renal veins. No pathologically enlarged lymph nodes in the abdomen or pelvis. Reproductive: Grossly normal uterus.  No adnexal mass. Other: No pneumoperitoneum. Small to moderate volume hemoperitoneum in the right paracolic gutter and pelvic cul-de-sac. Musculoskeletal: No aggressive appearing focal osseous lesions. No evidence of a fracture. IMPRESSION: 1. Large 8.5 cm hypodense indeterminate posterior right liver lobe mass, which is directly contiguous with small to moderate volume posterior right perihepatic acute hemoperitoneum. Differential includes acute hemorrhagic rupture of an underlying liver mass (such as an hepatic adenoma in a young female) into the perihepatic space versus traumatic liver laceration. Short-term follow-up MRI abdomen without and with IV contrast recommended for further characterization. 2. Small to moderate volume hemoperitoneum in the right paracolic gutter and pelvic cul-de-sac. These results were called by telephone at the time of interpretation on 04/24/2018 at 8:05 am [facility] verb[facility] these results. Electronically Signed   By: JIlona SorrelM.D.   On: 04/24/2018 08:07   Ir Angiogram Visceral Selective  Result Date: 04/28/2018 INDICATION: Intraperitoneal hemorrhage from hepatic adenoma EXAM: HEPATIC ARTERY EMBOLIZATION, VISCERAL ANGIOGRAPHY, ARTERIOGRAPHY THROUGH EXISTING VESSEL, ULTRASOUND GUIDANCE MEDICATIONS: None ANESTHESIA/SEDATION: Moderate (conscious) sedation was employed  during this procedure. A total of Versed 3 mg and Fentanyl 100 mcg was administered intravenously. Moderate Sedation Time: 70 minutes. The patient's level of consciousness and vital signs were monitored continuously by radiology nursing throughout the procedure under my direct supervision. CONTRAST:  150 cc Isovue-300 FLUOROSCOPY TIME:  Fluoroscopy Time: 20 minutes 12 seconds (1,190 mGy). COMPLICATIONS: None immediate. PROCEDURE: Informed consent was obtained from the patient following explanation of the procedure, risks, benefits and alternatives. The patient understands, agrees and consents for the procedure. All questions were addressed. A time out was performed prior to the initiation of the procedure. Maximal barrier sterile technique utilized including caps, mask, sterile gowns, sterile gloves, large sterile drape, hand hygiene, and Betadine prep. The right groin was prepped and draped in a sterile fashion. 1% lidocaine was utilized for local anesthesia. Under sonographic guidance, a micropuncture needle was inserted into the right common femoral  artery. Ultrasound documentation was obtained. The vessel was noted to be patent. This was up sized to a Bentson. A 5 French sheath was inserted. A cobra 2 catheter was advanced into the aorta. The Cobra was advanced over the Bentson wire into the celiac axis. Angiography was performed. The catheter was then advanced into the splenic artery. Arteriography was performed. Delayed images demonstrate patency of the portal vein. The catheter was retracted then advanced over a glidewire into the common hepatic artery. Angiography was performed. It was then advanced into the right hepatic artery. Subsequent arteriography was performed. A Renegade high flow catheter was then advanced through the Cobra 2 catheter and into the right hepatic arterial circulation. Four branches of the right hepatic artery were selected and embolized with a combination of 500-700 micron  embospheres and Gel-Foam slurry. The microcatheter was removed and final imaging through the Cobra 2 catheter was performed. The Cobra catheter was retracted to the right external iliac artery and femoral angiography was performed. The catheter was removed with the sheath left in place. An ExoSeal device was deployed for hemostasis. FINDINGS: Celiac arteriography delineates anatomy. Splenic arteriography with delayed images demonstrate patency of the splenic and portal veins. Hepatic arterial injection delineates anatomy throughout the liver. There is a large filling defect within the right lobe. Within the filling defect, there are abnormal vessels and pooling of contrast. These areas are worrisome for pseudoaneurysm formation or possibly active contrast extravasation. It may simply represent foci of tumor blush. These areas correspond to the abnormal mass with hemorrhage on the accompanying MRI of the abdomen. After embolization, the abnormal vasculature was no longer filling. A wedge-shaped defect within the inferior right lobe of the liver was identified. This is consistent with successful embolization of the abnormal right lobe lesion. 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 A combination of embospheres and Gel-Foam slurry was utilized. Electronically Signed   By: Marybelle Killings M.D.   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 A total of Versed 3 mg and Fentanyl 100 mcg was administered intravenously. Moderate Sedation Time: 70 minutes. The patient's level of consciousness and vital signs were monitored continuously by radiology  nursing throughout the procedure under my direct supervision. CONTRAST:  150 cc Isovue-300 FLUOROSCOPY TIME:  Fluoroscopy Time: 20 minutes 12 seconds (1,190 mGy). COMPLICATIONS: None immediate. PROCEDURE: Informed consent was obtained from the patient following explanation of the procedure, risks, benefits and alternatives. The patient understands, agrees and consents for the procedure. All questions were addressed. A time out was performed prior to the initiation of the procedure. Maximal barrier sterile technique utilized including caps, mask, sterile gowns, sterile gloves, large sterile drape, hand hygiene, and Betadine prep. The right groin was prepped and draped in a sterile fashion. 1% lidocaine was utilized for local anesthesia. Under sonographic guidance, a micropuncture needle was inserted into the right common femoral artery. Ultrasound documentation was obtained. The vessel was noted to be patent. This was up sized to a Bentson. A 5 French sheath was inserted. A cobra 2 catheter was advanced into the aorta. The Cobra was advanced over the Bentson wire into the celiac axis. Angiography was performed. The catheter was then advanced into the splenic artery. Arteriography was performed. Delayed images demonstrate patency of  the portal vein. The catheter was retracted then advanced over a glidewire into the common hepatic artery. Angiography was performed. It was then advanced into the right hepatic artery. Subsequent arteriography was performed. A Renegade high flow catheter was then advanced through the Cobra 2 catheter and into the right hepatic arterial circulation. Four branches of the right hepatic artery were selected and embolized with a combination of 500-700 micron embospheres and Gel-Foam slurry. The microcatheter was removed and final imaging through the Cobra 2 catheter was performed. The Cobra catheter was retracted to the right external iliac artery and femoral angiography was performed. The  catheter was removed with the sheath left in place. An ExoSeal device was deployed for hemostasis. FINDINGS: Celiac arteriography delineates anatomy. Splenic arteriography with delayed images demonstrate patency of the splenic and portal veins. Hepatic arterial injection delineates anatomy throughout the liver. There is a large filling defect within the right lobe. Within the filling defect, there are abnormal vessels and pooling of contrast. These areas are worrisome for pseudoaneurysm formation or possibly active contrast extravasation. It may simply represent foci of tumor blush. These areas correspond to the abnormal mass with hemorrhage on the accompanying MRI of the abdomen. After embolization, the abnormal vasculature was no longer filling. A wedge-shaped defect within the inferior right lobe of the liver was identified. This is consistent with successful embolization of the abnormal right lobe lesion. 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 A combination of embospheres and Gel-Foam slurry was utilized. Electronically Signed   By: Marybelle Killings M.D.   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 A total of Versed 3 mg and Fentanyl 100 mcg was administered intravenously. Moderate Sedation Time: 70 minutes. The patient's level of consciousness and vital signs were monitored continuously by radiology nursing throughout the procedure under my direct supervision. CONTRAST:  150 cc Isovue-300 FLUOROSCOPY TIME:  Fluoroscopy Time: 20 minutes 12 seconds (1,190 mGy). COMPLICATIONS: None immediate. PROCEDURE: Informed consent was obtained  from the patient following explanation of the procedure, risks, benefits and alternatives. The patient understands, agrees and consents for the procedure. All questions were addressed. A time out was performed prior to the initiation of the procedure. Maximal barrier sterile technique utilized including caps, mask, sterile gowns, sterile gloves, large sterile drape, hand hygiene, and Betadine prep. The right groin was prepped and draped in a sterile fashion. 1% lidocaine was utilized for local anesthesia. Under sonographic guidance, a micropuncture needle was inserted into the right common femoral artery. Ultrasound documentation was obtained. The vessel was noted to be patent. This was up sized to a Bentson. A 5 French sheath was inserted. A cobra 2 catheter was advanced into the aorta. The Cobra was advanced over the Bentson wire into the celiac axis. Angiography was performed. The catheter was then advanced into the splenic artery. Arteriography was performed. Delayed images demonstrate patency of the portal vein. The catheter was retracted then advanced over a glidewire into the common hepatic artery. Angiography was performed. It was then advanced into the right hepatic artery. Subsequent arteriography was performed. A Renegade high flow catheter was then advanced through the Cobra 2 catheter and into the right hepatic arterial circulation. Four branches of the right hepatic artery were selected and embolized with a  combination of 500-700 micron embospheres and Gel-Foam slurry. The microcatheter was removed and final imaging through the Cobra 2 catheter was performed. The Cobra catheter was retracted to the right external iliac artery and femoral angiography was performed. The catheter was removed with the sheath left in place. An ExoSeal device was deployed for hemostasis. FINDINGS: Celiac arteriography delineates anatomy. Splenic arteriography with delayed images demonstrate patency of the splenic and portal  veins. Hepatic arterial injection delineates anatomy throughout the liver. There is a large filling defect within the right lobe. Within the filling defect, there are abnormal vessels and pooling of contrast. These areas are worrisome for pseudoaneurysm formation or possibly active contrast extravasation. It may simply represent foci of tumor blush. These areas correspond to the abnormal mass with hemorrhage on the accompanying MRI of the abdomen. After embolization, the abnormal vasculature was no longer filling. A wedge-shaped defect within the inferior right lobe of the liver was identified. This is consistent with successful embolization of the abnormal right lobe lesion. 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 A combination of embospheres and Gel-Foam slurry was utilized. Electronically Signed   By: Marybelle Killings M.D.   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 A total of Versed 3 mg and Fentanyl 100 mcg was administered intravenously. Moderate Sedation Time: 70 minutes. The patient's level of consciousness and vital signs were monitored continuously by radiology nursing throughout the procedure under my direct supervision. CONTRAST:  150 cc Isovue-300 FLUOROSCOPY TIME:  Fluoroscopy Time: 20 minutes 12 seconds (1,190 mGy). COMPLICATIONS: None immediate. PROCEDURE: Informed consent was obtained from the patient following explanation of the procedure, risks, benefits and alternatives. The patient understands, agrees and consents for the procedure. All questions were addressed. A time out was performed prior to the initiation of  the procedure. Maximal barrier sterile technique utilized including caps, mask, sterile gowns, sterile gloves, large sterile drape, hand hygiene, and Betadine prep. The right groin was prepped and draped in a sterile fashion. 1% lidocaine was utilized for local anesthesia. Under sonographic guidance, a micropuncture needle was inserted into the right common femoral artery. Ultrasound documentation was obtained. The vessel was noted to be patent. This was up sized to a Bentson. A 5 French sheath was inserted. A cobra 2 catheter was advanced into the aorta. The Cobra was advanced over the Bentson wire into the celiac axis. Angiography was performed. The catheter was then advanced into the splenic artery. Arteriography was performed. Delayed images demonstrate patency of the portal vein. The catheter was retracted then advanced over a glidewire into the common hepatic artery. Angiography was performed. It was then advanced into the right hepatic artery. Subsequent arteriography was performed. A Renegade high flow catheter was then advanced through the Cobra 2 catheter and into the right hepatic arterial circulation. Four branches of the right hepatic artery were selected and embolized with a combination of 500-700 micron embospheres and Gel-Foam slurry. The microcatheter was removed and final imaging through the Cobra 2 catheter was performed. The Cobra catheter was retracted to the right external iliac artery and femoral angiography was performed. The catheter was removed with the sheath left in place. An ExoSeal device was deployed for hemostasis. FINDINGS: Celiac arteriography delineates anatomy. Splenic arteriography with delayed images demonstrate  patency of the splenic and portal veins. Hepatic arterial injection delineates anatomy throughout the liver. There is a large filling defect within the right lobe. Within the filling defect, there are abnormal vessels and pooling of contrast. These areas are worrisome  for pseudoaneurysm formation or possibly active contrast extravasation. It may simply represent foci of tumor blush. These areas correspond to the abnormal mass with hemorrhage on the accompanying MRI of the abdomen. After embolization, the abnormal vasculature was no longer filling. A wedge-shaped defect within the inferior right lobe of the liver was identified. This is consistent with successful embolization of the abnormal right lobe lesion. 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 A combination of embospheres and Gel-Foam slurry was utilized. Electronically Signed   By: Marybelle Killings M.D.   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 A total of Versed 3 mg and Fentanyl 100 mcg was administered intravenously. Moderate Sedation Time: 70 minutes. The patient's level of consciousness and vital signs were monitored continuously by radiology nursing throughout the procedure under my direct supervision. CONTRAST:  150 cc Isovue-300 FLUOROSCOPY TIME:  Fluoroscopy Time: 20 minutes 12 seconds (1,190 mGy). COMPLICATIONS: None immediate. PROCEDURE: Informed consent was obtained from the patient following explanation of the procedure, risks, benefits and alternatives. The patient understands, agrees and consents for the procedure. All questions were addressed. A time out was performed prior to the initiation of the procedure. Maximal barrier sterile technique utilized including caps, mask, sterile gowns, sterile gloves, large sterile drape, hand hygiene, and Betadine prep. The right groin was prepped and draped in a sterile fashion. 1%  lidocaine was utilized for local anesthesia. Under sonographic guidance, a micropuncture needle was inserted into the right common femoral artery. Ultrasound documentation was obtained. The vessel was noted to be patent. This was up sized to a Bentson. A 5 French sheath was inserted. A cobra 2 catheter was advanced into the aorta. The Cobra was advanced over the Bentson wire into the celiac axis. Angiography was performed. The catheter was then advanced into the splenic artery. Arteriography was performed. Delayed images demonstrate patency of the portal vein. The catheter was retracted then advanced over a glidewire into the common hepatic artery. Angiography was performed. It was then advanced into the right hepatic artery. Subsequent arteriography was performed. A Renegade high flow catheter was then advanced through the Cobra 2 catheter and into the right hepatic arterial circulation. Four branches of the right hepatic artery were selected and embolized with a combination of 500-700 micron embospheres and Gel-Foam slurry. The microcatheter was removed and final imaging through the Cobra 2 catheter was performed. The Cobra catheter was retracted to the right external iliac artery and femoral angiography was performed. The catheter was removed with the sheath left in place. An ExoSeal device was deployed for hemostasis. FINDINGS: Celiac arteriography delineates anatomy. Splenic arteriography with delayed images demonstrate patency of the splenic and portal veins. Hepatic arterial injection delineates anatomy throughout the liver. There is a large filling defect within the right lobe. Within the filling defect, there are abnormal vessels and pooling of contrast. These areas are worrisome for pseudoaneurysm formation or possibly active contrast extravasation. It may simply represent foci of tumor blush. These areas correspond to the abnormal mass with hemorrhage  on the accompanying MRI of the abdomen. After  embolization, the abnormal vasculature was no longer filling. A wedge-shaped defect within the inferior right lobe of the liver was identified. This is consistent with successful embolization of the abnormal right lobe lesion. 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 A combination of embospheres and Gel-Foam slurry was utilized. Electronically Signed   By: Marybelle Killings M.D.   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 A total of Versed 3 mg and Fentanyl 100 mcg was administered intravenously. Moderate Sedation Time: 70 minutes. The patient's level of consciousness and vital signs were monitored continuously by radiology nursing throughout the procedure under my direct supervision. CONTRAST:  150 cc Isovue-300 FLUOROSCOPY TIME:  Fluoroscopy Time: 20 minutes 12 seconds (1,190 mGy). COMPLICATIONS: None immediate. PROCEDURE: Informed consent was obtained from the patient following explanation of the procedure, risks, benefits and alternatives. The patient understands, agrees and consents for the procedure. All questions were addressed. A time out was performed prior to the initiation of the procedure. Maximal barrier sterile technique utilized including caps, mask, sterile gowns, sterile gloves, large sterile drape, hand hygiene, and Betadine prep. The right groin was prepped and draped in a sterile fashion. 1% lidocaine was utilized for local anesthesia. Under sonographic guidance, a micropuncture needle was inserted into the right common femoral artery. Ultrasound documentation was obtained. The vessel was noted to be patent. This was  up sized to a Bentson. A 5 French sheath was inserted. A cobra 2 catheter was advanced into the aorta. The Cobra was advanced over the Bentson wire into the celiac axis. Angiography was performed. The catheter was then advanced into the splenic artery. Arteriography was performed. Delayed images demonstrate patency of the portal vein. The catheter was retracted then advanced over a glidewire into the common hepatic artery. Angiography was performed. It was then advanced into the right hepatic artery. Subsequent arteriography was performed. A Renegade high flow catheter was then advanced through the Cobra 2 catheter and into the right hepatic arterial circulation. Four branches of the right hepatic artery were selected and embolized with a combination of 500-700 micron embospheres and Gel-Foam slurry. The microcatheter was removed and final imaging through the Cobra 2 catheter was performed. The Cobra catheter was retracted to the right external iliac artery and femoral angiography was performed. The catheter was removed with the sheath left in place. An ExoSeal device was deployed for hemostasis. FINDINGS: Celiac arteriography delineates anatomy. Splenic arteriography with delayed images demonstrate patency of the splenic and portal veins. Hepatic arterial injection delineates anatomy throughout the liver. There is a large filling defect within the right lobe. Within the filling defect, there are abnormal vessels and pooling of contrast. These areas are worrisome for pseudoaneurysm formation or possibly active contrast extravasation. It may simply represent foci of tumor blush. These areas correspond to the abnormal mass with hemorrhage on the accompanying MRI of the abdomen. After embolization, the abnormal vasculature was no longer filling. A wedge-shaped defect within the inferior right lobe of the liver was identified. This is consistent with successful embolization of the abnormal right lobe lesion. IMPRESSION:  Successful hepatic arterial embolization of a hemorrhagic right hepatic lobe mass. A combination of embospheres and Gel-Foam slurry was utilized. Electronically Signed  By: Marybelle Killings M.D.   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 A total of Versed 3 mg and Fentanyl 100 mcg was administered intravenously. Moderate Sedation Time: 70 minutes. The patient's level of consciousness and vital signs were monitored continuously by radiology nursing throughout the procedure under my direct supervision. CONTRAST:  150 cc Isovue-300 FLUOROSCOPY TIME:  Fluoroscopy Time: 20 minutes 12 seconds (1,190 mGy). COMPLICATIONS: None immediate. PROCEDURE: Informed consent was obtained from the patient following explanation of the procedure, risks, benefits and alternatives. The patient understands, agrees and consents for the procedure. All questions were addressed. A time out was performed prior to the initiation of the procedure. Maximal barrier sterile technique utilized including caps, mask, sterile gowns, sterile gloves, large sterile drape, hand hygiene, and Betadine prep. The right groin was prepped and draped in a sterile fashion. 1% lidocaine was utilized for local anesthesia. Under sonographic guidance, a micropuncture needle was inserted into the right common femoral artery. Ultrasound documentation was obtained. The vessel was noted to be patent. This was up sized to a Bentson. A 5 French sheath was inserted. A cobra 2 catheter was advanced into the aorta. The Cobra was advanced over the Bentson wire into the celiac axis. Angiography was performed. The catheter was then advanced into the  splenic artery. Arteriography was performed. Delayed images demonstrate patency of the portal vein. The catheter was retracted then advanced over a glidewire into the common hepatic artery. Angiography was performed. It was then advanced into the right hepatic artery. Subsequent arteriography was performed. A Renegade high flow catheter was then advanced through the Cobra 2 catheter and into the right hepatic arterial circulation. Four branches of the right hepatic artery were selected and embolized with a combination of 500-700 micron embospheres and Gel-Foam slurry. The microcatheter was removed and final imaging through the Cobra 2 catheter was performed. The Cobra catheter was retracted to the right external iliac artery and femoral angiography was performed. The catheter was removed with the sheath left in place. An ExoSeal device was deployed for hemostasis. FINDINGS: Celiac arteriography delineates anatomy. Splenic arteriography with delayed images demonstrate patency of the splenic and portal veins. Hepatic arterial injection delineates anatomy throughout the liver. There is a large filling defect within the right lobe. Within the filling defect, there are abnormal vessels and pooling of contrast. These areas are worrisome for pseudoaneurysm formation or possibly active contrast extravasation. It may simply represent foci of tumor blush. These areas correspond to the abnormal mass with hemorrhage on the accompanying MRI of the abdomen. After embolization, the abnormal vasculature was no longer filling. A wedge-shaped defect within the inferior right lobe of the liver was identified. This is consistent with successful embolization of the abnormal right lobe lesion. 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 A combination of embospheres and Gel-Foam slurry was utilized. Electronically Signed   By: Marybelle Killings M.D.   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 A total of Versed 3 mg and Fentanyl 100 mcg was administered intravenously. Moderate Sedation  Time: 70 minutes. The patient's level of consciousness and vital signs were monitored continuously by radiology nursing throughout the procedure under my direct supervision. CONTRAST:  150 cc Isovue-300 FLUOROSCOPY TIME:  Fluoroscopy Time: 20 minutes 12 seconds (1,190 mGy). COMPLICATIONS: None immediate. PROCEDURE: Informed consent was obtained from the patient following explanation of the procedure, risks, benefits and alternatives. The patient understands, agrees and consents for the procedure. All questions were addressed. A time out was performed prior to the initiation of the procedure. Maximal barrier sterile technique utilized including caps, mask, sterile gowns, sterile gloves, large sterile drape, hand hygiene, and Betadine prep. The right groin was prepped and draped in a sterile fashion. 1% lidocaine was utilized for local anesthesia. Under sonographic guidance, a micropuncture needle was inserted into the right common femoral artery. Ultrasound documentation was obtained. The vessel was noted to be patent. This was up sized to a Bentson. A 5 French sheath was inserted. A cobra 2 catheter was advanced into the aorta. The Cobra was advanced over the Bentson wire into the celiac axis. Angiography was performed. The catheter was then advanced into the splenic artery. Arteriography was performed. Delayed images demonstrate patency of the portal vein. The catheter was retracted then advanced over a glidewire into the common hepatic artery. Angiography was performed. It was then  advanced into the right hepatic artery. Subsequent arteriography was performed. A Renegade high flow catheter was then advanced through the Cobra 2 catheter and into the right hepatic arterial circulation. Four branches of the right hepatic artery were selected and embolized with a combination of 500-700 micron embospheres and Gel-Foam slurry. The microcatheter was removed and final imaging through the Cobra 2 catheter was performed. The Cobra catheter was retracted to the right external iliac artery and femoral angiography was performed. The catheter was removed with the sheath left in place. An ExoSeal device was deployed for hemostasis. FINDINGS: Celiac arteriography delineates anatomy. Splenic arteriography with delayed images demonstrate patency of the splenic and portal veins. Hepatic arterial injection delineates anatomy throughout the liver. There is a large filling defect within the right lobe. Within the filling defect, there are abnormal vessels and pooling of contrast. These areas are worrisome for pseudoaneurysm formation or possibly active contrast extravasation. It may simply represent foci of tumor blush. These areas correspond to the abnormal mass with hemorrhage on the accompanying MRI of the abdomen. After embolization, the abnormal vasculature was no longer filling. A wedge-shaped defect within the inferior right lobe of the liver was identified. This is consistent with successful embolization of the abnormal right lobe lesion. 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 A combination of embospheres and Gel-Foam slurry was utilized. Electronically Signed   By: Marybelle Killings M.D.   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 A total of Versed 3 mg and Fentanyl 100 mcg was administered intravenously. Moderate Sedation Time: 70 minutes. The patient's level of consciousness and vital signs were monitored continuously by radiology nursing throughout the procedure under my direct supervision. CONTRAST:  150 cc Isovue-300 FLUOROSCOPY TIME:  Fluoroscopy Time: 20 minutes 12 seconds (1,190 mGy). COMPLICATIONS: None immediate. PROCEDURE: Informed consent was obtained from the patient following explanation of the procedure, risks, benefits and alternatives. The patient understands, agrees and consents  for the procedure. All questions were addressed. A time out was performed prior to the initiation of the procedure. Maximal barrier sterile technique utilized including caps, mask, sterile gowns, sterile gloves, large sterile drape, hand hygiene, and Betadine prep. The right groin was prepped and draped in a sterile fashion. 1% lidocaine was utilized for local anesthesia. Under sonographic guidance, a micropuncture needle was inserted into the right common femoral artery. Ultrasound documentation was obtained. The vessel was noted to be patent. This was up sized to a Bentson. A 5 French sheath was inserted. A cobra 2 catheter was advanced into the aorta. The Cobra was advanced over the Bentson wire into the celiac axis. Angiography was performed. The catheter was then advanced into the splenic artery. Arteriography was performed. Delayed images demonstrate patency of the portal vein. The catheter was retracted then advanced over a glidewire into the common hepatic artery. Angiography was performed. It was then advanced into the right hepatic artery. Subsequent arteriography was performed. A Renegade high flow catheter was then advanced through the Cobra 2 catheter and into the right hepatic arterial circulation. Four branches of the right hepatic artery were  selected and embolized with a combination of 500-700 micron embospheres and Gel-Foam slurry. The microcatheter was removed and final imaging through the Cobra 2 catheter was performed. The Cobra catheter was retracted to the right external iliac artery and femoral angiography was performed. The catheter was removed with the sheath left in place. An ExoSeal device was deployed for hemostasis. FINDINGS: Celiac arteriography delineates anatomy. Splenic arteriography with delayed images demonstrate patency of the splenic and portal veins. Hepatic arterial injection delineates anatomy throughout the liver. There is a large filling defect within the right lobe. Within the filling defect, there are abnormal vessels and pooling of contrast. These areas are worrisome for pseudoaneurysm formation or possibly active contrast extravasation. It may simply represent foci of tumor blush. These areas correspond to the abnormal mass with hemorrhage on the accompanying MRI of the abdomen. After embolization, the abnormal vasculature was no longer filling. A wedge-shaped defect within the inferior right lobe of the liver was identified. This is consistent with successful embolization of the abnormal right lobe lesion. 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 A combination of embospheres and Gel-Foam slurry was utilized. Electronically Signed   By: Marybelle Killings M.D.   On: 04/28/2018 09:01   Dg Abd 2 Views  Result Date: 04/24/2018 CLINICAL DATA:  27 y/o  F; mid abdominal pain, nausea, vomiting. EXAM: ABDOMEN - 2 VIEW COMPARISON:  None. FINDINGS: Nonobstructive bowel gas pattern. Large volume of stool in the colon may represent constipation. Bones are unremarkable. No pneumoperitoneum. IMPRESSION: Nonobstructive bowel gas pattern. Large volume of stool in the colon may represent constipation. Electronically Signed   By: Kristine Garbe M.D.   On: 04/24/2018 05:42   Ir  Embo Art  [facility] Hemorr Lymph [place] Guide Roadmapping  Addendum Date: 04/28/2018   ADDENDUM REPORT: 04/28/2018 10:56 ADDENDUM: There were form vessels in the right hepatic arterial tree that were selected. These are all fifth order. Electronically Signed   By: Marybelle Killings M.D.   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 A total of Versed 3 mg and Fentanyl 100 mcg was administered intravenously. Moderate Sedation Time: 70 minutes.  The patient's level of consciousness and vital signs were monitored continuously by radiology nursing throughout the procedure under my direct supervision. CONTRAST:  150 cc Isovue-300 FLUOROSCOPY TIME:  Fluoroscopy Time: 20 minutes 12 seconds (1,190 mGy). COMPLICATIONS: None immediate. PROCEDURE: Informed consent was obtained from the patient following explanation of the procedure, risks, benefits and alternatives. The patient understands, agrees and consents for the procedure. All questions were addressed. A time out was performed prior to the initiation of the procedure. Maximal barrier sterile technique utilized including caps, mask, sterile gowns, sterile gloves, large sterile drape, hand hygiene, and Betadine prep. The right groin was prepped and draped in a sterile fashion. 1% lidocaine was utilized for local anesthesia. Under sonographic guidance, a micropuncture needle was inserted into the right common femoral artery. Ultrasound documentation was obtained. The vessel was noted to be patent. This was up sized to a Bentson. A 5 French sheath was inserted. A cobra 2 catheter was advanced into the aorta. The Cobra was advanced over the Bentson wire into the celiac axis. Angiography was performed.  The catheter was then advanced into the splenic artery. Arteriography was performed. Delayed images demonstrate patency of the portal vein. The catheter was retracted then advanced over a glidewire into the common hepatic artery. Angiography was performed. It was then advanced into the right hepatic artery. Subsequent arteriography was performed. A Renegade high flow catheter was then advanced through the Cobra 2 catheter and into the right hepatic arterial circulation. Four branches of the right hepatic artery were selected and embolized with a combination of 500-700 micron embospheres and Gel-Foam slurry. The microcatheter was removed and final imaging through the Cobra 2 catheter was performed. The Cobra catheter was retracted to the right external iliac artery and femoral angiography was performed. The catheter was removed with the sheath left in place. An ExoSeal device was deployed for hemostasis. FINDINGS: Celiac arteriography delineates anatomy. Splenic arteriography with delayed images demonstrate patency of the splenic and portal veins. Hepatic arterial injection delineates anatomy throughout the liver. There is a large filling defect within the right lobe. Within the filling defect, there are abnormal vessels and pooling of contrast. These areas are worrisome for pseudoaneurysm formation or possibly active contrast extravasation. It may simply represent foci of tumor blush. These areas correspond to the abnormal mass with hemorrhage on the accompanying MRI of the abdomen. After embolization, the abnormal vasculature was no longer filling. A wedge-shaped defect within the inferior right lobe of the liver was identified. This is consistent with successful embolization of the abnormal right lobe lesion. 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 A combination of embospheres and Gel-Foam slurry was utilized. Electronically Signed: By: Marybelle Killings M.D. On:  04/28/2018 09:03    Lab Data:  CBC: Recent Labs  Lab 04/24/18 0856  04/26/18 0435 04/27/18 0443 04/28/18 0403 04/29/18 0713 04/30/18 0425  WBC 11.3*   < > 9.9 10.3 12.2* 12.1* 13.3*  NEUTROABS 9.0*  --   --   --   --   --   --   HGB 9.4*   < > 8.0* 8.1* 8.2* 8.8* 7.9*  HCT 29.3*   < > 24.4* 24.9* 26.1* 27.5* 24.4*  MCV 90.7   < > 90.7 91.2 90.0 91.7 90.7  PLT 219   < > 182 217 289 341 399   < > = values in this interval not displayed.   Basic Metabolic Panel: Recent Labs  Lab 04/24/18 0231 04/25/18 6384  NA 142 138  K 4.1 3.6  CL 107 104  CO2 24 26  GLUCOSE 154* 125*  BUN 15 6  CREATININE 0.81 0.78  CALCIUM 9.5 8.5*   GFR: CrCl cannot be calculated (Unknown ideal weight.). Liver Function Tests: Recent Labs  Lab 04/24/18 0231 04/25/18 0956  AST 67* 273*  ALT 65* 420*  ALKPHOS 56 63  BILITOT 0.5 0.4  PROT 8.0 6.3*  ALBUMIN 4.4 3.6   Recent Labs  Lab 04/24/18 0231  LIPASE 35   No results for input(s): AMMONIA in the last 168 hours. Coagulation Profile: Recent Labs  Lab 04/24/18 0856  INR 1.07   Cardiac Enzymes: No results for input(s): CKTOTAL, CKMB, CKMBINDEX, TROPONINI in the last 168 hours. BNP (last 3 results) No results for input(s): PROBNP in the last 8760 hours. HbA1C: No results for input(s): HGBA1C in the last 72 hours. CBG: Recent Labs  Lab 04/26/18 1239 04/26/18 1717 04/26/18 2130 04/27/18 0825 04/27/18 1845  GLUCAP 125* 113* 99 124* 84   Lipid Profile: No results for input(s): CHOL, HDL, LDLCALC, TRIG, CHOLHDL, LDLDIRECT in the last 72 hours. Thyroid Function Tests: No results for input(s): TSH, T4TOTAL, FREET4, T3FREE, THYROIDAB in the last 72 hours. Anemia Panel: No results for input(s): VITAMINB12, FOLATE, FERRITIN, TIBC, IRON, RETICCTPCT in the last 72 hours. Urine analysis:    Component Value Date/Time   COLORURINE YELLOW 04/27/2018 [place] 04/27/2018 1227   LABSPEC 1.020 04/27/2018 1227    PHURINE 6.0 04/27/2018 1227   GLUCOSEU NEGATIVE 04/27/2018 1227   HGBUR NEGATIVE 04/27/2018 1227   BILIRUBINUR NEGATIVE 04/27/2018 1227   KETONESUR 5 (A) 04/27/2018 1227   PROTEINUR NEGATIVE 04/27/2018 1227   NITRITE NEGATIVE 04/27/2018 1227   LEUKOCYTESUR NEGATIVE 04/27/2018 1227     Jemmie Ledgerwood M.D. [facility] 04/30/2018, 9:33 AM  Pager: [phone] Between 7am to 7pm - call Pager - [phone]  After 7pm go to [email or web address] - password [email or web address]  Call night coverage person covering after 7pm

## 2018-04-30 NOTE — Evaluation (Signed)
Physical Therapy Evaluation Patient Details Name: Latoya Kramer MRN: [id number] DOB: 26-Jan-1991 Today's Date: 04/30/2018   History of Present Illness  27 y.o. [name] Witness with a hx of allergy, anemia, HTN and obesity who presented to the ED with abdominal pain.  CT abdom noted a posterior right liver lobe mass with moderate volume perihepatic acute hemoperitoneum.      Clinical Impression  Pt admitted with above diagnosis. Pt currently with functional limitations due to the deficits listed below (see PT Problem List). PTA, pt living alone in 2nd story apartment independent with all mobility, driving, working. Upon eval pt presents with mild pain, deconditioning. Pt also with questionable ambition during PT visit. Ambulated 200' and 6 stairs today, SpO2 hovered around 90-91% on RA with activity and became mildly dyspneic with stairs today. Instructed patient for proper use of IS. Patient wold benefit from another session to focus on stair training before being safe to return home once medically stable.  Pt will benefit from skilled PT to increase their independence and safety with mobility to allow discharge to the venue listed below.       Follow Up Recommendations No PT follow up;Supervision for mobility/OOB    Equipment Recommendations  Rolling walker with 5" wheels    Recommendations for Other Services       Precautions / Restrictions Precautions Precautions: Fall Restrictions Weight Bearing Restrictions: No      Mobility  Bed Mobility Overal bed mobility: Needs Assistance Bed Mobility: Supine to Sit     Supine to sit: HOB elevated;Min assist     General bed mobility comments: OOB at entry  Transfers Overall transfer level: Needs assistance Equipment used: Rolling walker (2 wheeled) Transfers: Sit to/from Stand Sit to Stand: Supervision         General transfer comment: Cue for hand placement.   Ambulation/Gait Ambulation/Gait assistance: Min  guard;Supervision Ambulation Distance (Feet): 200 Feet Assistive device: Rolling walker (2 wheeled) Gait Pattern/deviations: Step-to pattern;Step-through pattern Gait velocity: decreased   General Gait Details: very slow and cautious, citing pain. RW for safety unsteady without at this time.    Stairs            Wheelchair Mobility    Modified Rankin (Stroke Patients Only)       Balance Overall balance assessment: Mild deficits observed, not formally tested                                           Pertinent Vitals/Pain Pain Assessment: 0-10 Pain Score: 4  Pain Location: Right side/flank Pain Descriptors / Indicators: Sore Pain Intervention(s): Limited activity within patient's tolerance;Monitored during session;Premedicated before session;Repositioned    Home Living Family/patient expects to be discharged to:: Private residence Living Arrangements: Alone Available Help at Discharge: Family;Available 24 hours/day Type of Home: Apartment Home Access: Stairs to enter Entrance Stairs-Rails: Left Entrance Stairs-Number of Steps: 10 Home Layout: One level        Prior Function Level of Independence: Independent               Hand Dominance        Extremity/Trunk Assessment   Upper Extremity Assessment Upper Extremity Assessment: Overall WFL for tasks assessed    Lower Extremity Assessment Lower Extremity Assessment: Overall WFL for tasks assessed       Communication   Communication: No difficulties  Cognition Arousal/Alertness: Awake/alert Behavior During  Therapy: WFL for tasks assessed/performed Overall Cognitive Status: Within Functional Limits for tasks assessed                                        General Comments General comments (skin integrity, edema, etc.): Patient satting 90-93% on RA during session with activity.     Exercises     Assessment/Plan    PT Assessment Patient needs continued PT  services  PT Problem List Decreased strength;Decreased range of motion;Pain;Obesity       PT Treatment Interventions DME instruction;Stair training;Gait training;Functional mobility training;Therapeutic exercise;Therapeutic activities    PT Goals (Current goals can be found in the Care Plan section)  Acute Rehab PT Goals Patient Stated Goal: go home to her own apt or to her mothers house PT Goal Formulation: With patient Time For Goal Achievement: 05/14/18 Potential to Achieve Goals: Good    Frequency Min 3X/week   Barriers to discharge        Co-evaluation               AM-PAC PT "6 Clicks" Daily Activity  Outcome Measure Difficulty turning over in bed (including adjusting bedclothes, sheets and blankets)?: A Little Difficulty moving from lying on back to sitting on the side of the bed? : A Little Difficulty sitting down on and standing up from a chair with arms (e.g., wheelchair, bedside commode, etc,.)?: A Little Help needed moving to and from a bed to chair (including a wheelchair)?: A Little Help needed walking in hospital room?: A Little Help needed climbing 3-5 steps with a railing? : A Little 6 Click Score: 18    End of Session Equipment Utilized During Treatment: Gait belt Activity Tolerance: Patient tolerated treatment well Patient left: in chair;with nursing/sitter in room;with call bell/phone within reach Nurse Communication: Mobility status PT Visit Diagnosis: Unsteadiness on feet (R26.81);Pain Pain - Right/Left: Right Pain - part of body: (lower abdomen/flank)    Time: 1100-1136 PT Time Calculation (min) (ACUTE ONLY): 36 min   Charges:   PT Evaluation $PT Eval Low Complexity: 1 Low PT Treatments $Gait Training: 8-22 mins   PT G Codes:       Reinaldo Berber, PT, DPT Acute Rehab Services Pager: [phone]    Reinaldo Berber 04/30/2018, 11:37 AM

## 2018-05-01 ENCOUNTER — Inpatient Hospital Stay (HOSPITAL_COMMUNITY): Payer: BC Managed Care – PPO

## 2018-05-01 DIAGNOSIS — R1031 Right lower quadrant pain: Secondary | ICD-10-CM

## 2018-05-01 LAB — CBC
HEMATOCRIT: 23.9 % — AB (ref 36.0–46.0)
HEMOGLOBIN: 7.6 g/dL — AB (ref 12.0–15.0)
MCH: 29.1 pg (ref 26.0–34.0)
MCHC: 31.8 g/dL (ref 30.0–36.0)
MCV: 91.6 fL (ref 78.0–100.0)
Platelets: 432 10*3/uL — ABNORMAL HIGH (ref 150–400)
RBC: 2.61 MIL/uL — ABNORMAL LOW (ref 3.87–5.11)
RDW: 15.2 % (ref 11.5–15.5)
WBC: 12.5 10*3/uL — ABNORMAL HIGH (ref 4.0–10.5)

## 2018-05-01 LAB — BASIC METABOLIC PANEL
ANION GAP: 10 (ref 5–15)
BUN: 5 mg/dL — AB (ref 6–20)
CALCIUM: 8.6 mg/dL — AB (ref 8.9–10.3)
CO2: 24 mmol/L (ref 22–32)
Chloride: 104 mmol/L (ref 101–111)
Creatinine, Ser: 0.78 mg/dL (ref 0.44–1.00)
GFR calc Af Amer: >60 mL/min (ref >60)
GFR calc non Af Amer: >60 mL/min (ref >60)
Glucose, Bld: 115 mg/dL — ABNORMAL HIGH (ref 65–99)
POTASSIUM: 3.4 mmol/L — AB (ref 3.5–5.1)
Sodium: 138 mmol/L (ref 135–145)

## 2018-05-01 MED ORDER — SODIUM CHLORIDE 0.9 % IV SOLN: 500.0000 mg | INTRAVENOUS | Status: DC

## 2018-05-01 MED ORDER — DIPHENHYDRAMINE HCL 25 MG PO CAPS: 25.0000 mg | ORAL_CAPSULE | Freq: Every day | ORAL | 0 refills | Status: DC

## 2018-05-01 MED ORDER — ALPRAZOLAM 0.25 MG PO TABS: 0.2500 mg | ORAL_TABLET | Freq: Three times a day (TID) | ORAL | 0 refills | Status: DC | PRN

## 2018-05-01 MED ORDER — METOPROLOL TARTRATE 50 MG PO TABS: 50.0000 mg | ORAL_TABLET | Freq: Two times a day (BID) | ORAL | Status: DC

## 2018-05-01 MED ORDER — TRAMADOL HCL 50 MG PO TABS: 50.0000 mg | ORAL_TABLET | Freq: Four times a day (QID) | ORAL | Status: DC | PRN

## 2018-05-01 MED ORDER — POLYETHYLENE GLYCOL 3350 17 G PO PACK: 17.0000 g | PACK | Freq: Every day | ORAL | 0 refills | Status: DC | PRN

## 2018-05-01 MED ORDER — SENNOSIDES-DOCUSATE SODIUM 8.6-50 MG PO TABS: 2.0000 | ORAL_TABLET | Freq: Every day | ORAL | Status: DC

## 2018-05-01 MED ORDER — ACETAMINOPHEN 325 MG PO TABS: 650.0000 mg | ORAL_TABLET | Freq: Four times a day (QID) | ORAL | Status: DC | PRN

## 2018-05-01 MED ORDER — BUTALBITAL-APAP-CAFFEINE 50-325-40 MG PO TABS: 1.0000 | ORAL_TABLET | Freq: Four times a day (QID) | ORAL | 0 refills | Status: DC | PRN

## 2018-05-01 MED ORDER — DARBEPOETIN ALFA 200 MCG/0.4ML IJ SOSY: 200.0000 ug | PREFILLED_SYRINGE | INTRAMUSCULAR | Status: DC

## 2018-05-01 MED ORDER — POLYETHYLENE GLYCOL 3350 17 G PO PACK: 17.0000 g | PACK | Freq: Every day | ORAL | Status: DC | PRN

## 2018-05-01 MED ORDER — POTASSIUM CHLORIDE CRYS ER 20 MEQ PO TBCR
40.0000 meq | EXTENDED_RELEASE_TABLET | Freq: Once | ORAL | Status: AC
  Administered 2018-05-01: 40 meq via ORAL
  Filled 2018-05-01: qty 2

## 2018-05-01 MED ORDER — SODIUM CHLORIDE 0.9 % IV SOLN: 1.0000 g | INTRAVENOUS | Status: DC

## 2018-05-01 MED ORDER — DIPHENHYDRAMINE HCL 25 MG PO CAPS
25.0000 mg | ORAL_CAPSULE | Freq: Once | ORAL | Status: AC
Start: 2018-05-01 — End: 2018-05-01
  Administered 2018-05-01: 25 mg via ORAL
  Filled 2018-05-01: qty 1

## 2018-05-01 MED ORDER — FOLIC ACID 1 MG PO TABS: 1.0000 mg | ORAL_TABLET | Freq: Every day | ORAL | Status: DC

## 2018-05-01 NOTE — Progress Notes (Signed)
Pt transferred to [facility] for further care. EMTALA form and discharge/transfer paperwork given to [facility]. Pt and mother given updates throughout process.

## 2018-05-01 NOTE — Progress Notes (Addendum)
[facility]                                                                              Patient Demographics  Latoya Kramer, is a 27 y.o. female, DOB - 1991-05-25, QQP:[id number]  Admit date - 04/24/2018   Admitting Physician Cristal Ford, DO  Outpatient Primary MD for the patient is Patient, No Pcp Per  Outpatient specialists:   LOS - 7  days   Medical records reviewed and are as summarized below:    Chief Complaint  Patient presents with  . Abdominal Pain       Brief summary   27yo F Jehovah's Witness w/ a hx of HTN and obesity who presented to the ED w/ abdominal pain. CT abdom noted a posterior right liver lobe mass with moderate volume perihepatic acute hemoperitoneum.    Assessment & Plan    Principal Problem:   Hemoperitoneum, nontraumatic -Patient had presented with abdominal pain, CT abdomen 5/5 showed large 8.5 cm hypodense indeterminate posterior right liver lobe mass, directly contiguous with small to moderate posterior right perihepatic acute hemoperitoneum.  Patient met Sirs criteria -General surgery was consulted and recommended interventional radiology.  Patient is Sales promotion account executive Witness. -Patient underwent IR embolization on 5/8 -Patient and mother aware that she will need tertiary care to discuss resection  Active Problems:   Anemia: Due to intra-abdominal bleeding as #1, Jehovah's Witness - Patient will not accept blood transfusions - H&H trending down 7.6, repeat CT abdomen today showed 8.7 cm mixed hyper and hypodense lesion in the right lobe liver with no significant change, unchanged hemoperitoneum around the liver pelvic ascites is decreased - will request IR to reevaluate, d/w Dr Earleen Newport, will evaluate for further management.  No bleeding or drainage on the dressing in the right groin. Addendum: 12:50PM Dr. Earleen Newport recommended CT angiogram of the abdomen with and without contrast if patient is continuing to have bleeding.   Discussed with the mother who at this point requested patient to be transferred to tertiary care center as was recommended initially, in case of worsening and needs needs surgery.  Called [facility] transfer line, patient was accepted by Dr Laddie Aquas, surgery.     Hypertension  - BP currently stable, on beta-blocker    Obesity -Recommended diet and weight control  Fever/pneumonia -Patient was noticed to have fever on 5/7, afebrile for last 24 hours.  Patient had reported recent bronchitis, chest x-ray showed possible left basilar pneumonia - Improving, continue IV Rocephin, WBC is trending down  -Follow blood cultures, UA negative  Code Status: Full CODE STATUS DVT Prophylaxis:  SCD's Family Communication: Discussed in detail with the patient, all imaging results, lab results explained to the patient and mother   Disposition Plan: Transfer to [facility] today  Time Spent in minutes >90 minutes coordinating care, discussing with various specialists, transfer to [facility]  Procedures:  IR embolization  Consultants:   IR General surgery  Antimicrobials:   IV Rocephin   Medications  Scheduled Meds: . [START ON 05/03/2018] Darbepoetin Alfa  200 mcg Subcutaneous Q Tue-1800  . diphenhydrAMINE  25 mg Oral QHS  .  feeding supplement  1 Container Oral TID BM  . folic acid  1 mg Oral Daily  . metoprolol tartrate  50 mg Oral BID  . senna-docusate  2 tablet Oral QHS   Continuous Infusions: . azithromycin Stopped (04/30/18 1951)  . cefTRIAXone (ROCEPHIN)  IV Stopped (04/30/18 1818)   PRN Meds:.acetaminophen, ALPRAZolam, butalbital-acetaminophen-caffeine, ondansetron **OR** ondansetron (ZOFRAN) IV, oxyCODONE, polyethylene glycol, traMADol   Antibiotics   Anti-infectives (From admission, onward)   Start     Dose/Rate Route Frequency Ordered Stop   04/27/18 1800  azithromycin (ZITHROMAX) 500 mg in sodium chloride 0.9 % 250 mL IVPB     500 mg 250 mL/hr over 60  Minutes Intravenous Every 24 hours 04/27/18 1647     04/27/18 1800  cefTRIAXone (ROCEPHIN) 1 g in sodium chloride 0.9 % 100 mL IVPB     1 g 200 mL/hr over 30 Minutes Intravenous Every 24 hours 04/27/18 1657          Subjective:   Latoya Kramer was seen and examined today.  Right lower quadrant discomfort close to the groin area.  Mild low-grade fever 100.1 F, coughing. patient denies dizziness, chest pain, shortness of breath, No acute events overnight.    Objective:   Vitals:   04/30/18 2038 05/01/18 0612 05/01/18 0830 05/01/18 0900  BP: 130/90 (!) 108/92 (!) 131/95   Pulse: (!) 105 79    Resp: 16 18    Temp: 98.9 F (37.2 C) 99 F (37.2 C) 100.1 F (37.8 C)   TempSrc: Oral Oral Oral   SpO2: 96% 95%  95%  Weight:       No intake or output data in the 24 hours ending 05/01/18 1124   Wt Readings from Last 3 Encounters:  04/29/18 95.3 kg (210 lb)  01/30/14 98.9 kg (218 lb)     Exam  General: Alert and oriented x 3, NAD Eyes:  HEENT:  Atraumatic, normocephalic Cardiovascular: S1 S2 auscultated, no rubs, murmurs or gallops. Regular rate and rhythm. No pedal edema b/l Respiratory: Clear to auscultation bilaterally, no wheezing, rales or rhonchi Gastrointestinal: Soft, mild right lower quadrant abdominal tenderness on deep palpation near the groin, nondistended, + bowel sounds Ext: no pedal edema bilaterally Neuro: no new deficit Musculoskeletal: No digital cyanosis, clubbing Skin: No rashes Psych: Normal affect and demeanor, alert and oriented x3      Data Reviewed:  I have personally reviewed following labs and imaging studies  Micro Results Recent Results (from the past 240 hour(s))  Culture, blood (routine x 2)     Status: None   Collection Time: 04/25/18  5:45 PM  Result Value Ref Range Status   Specimen Description BLOOD RIGHT ANTECUBITAL  Final   Special Requests   Final    BOTTLES DRAWN AEROBIC AND ANAEROBIC Blood Culture adequate volume   Culture    Final    NO GROWTH 5 DAYS Performed at [facility], [address], [place], [place] [address]    Report Status 04/30/2018 FINAL  Final  Culture, blood (routine x 2)     Status: None   Collection Time: 04/25/18  5:50 PM  Result Value Ref Range Status   Specimen Description BLOOD RIGHT ANTECUBITAL  Final   Special Requests   Final    BOTTLES DRAWN AEROBIC AND ANAEROBIC Blood Culture adequate volume   Culture   Final    NO GROWTH 5 DAYS Performed at [facility], [place] [address] [place], [place] [address]  Report Status 04/30/2018 FINAL  Final    Radiology Reports Ct Abdomen Pelvis Wo Contrast  Result Date: 05/01/2018 CLINICAL DATA:  Patient had a hemoperitoneum status post embolization this past Thursday. Now hemoglobin is trending down again. EXAM: CT ABDOMEN AND PELVIS WITHOUT CONTRAST TECHNIQUE: Multidetector CT imaging of the abdomen and pelvis was performed following the standard protocol without IV contrast. COMPARISON:  Apr 24, 2018 FINDINGS: Lower chest: Small right pleural effusion. Consolidation of bilateral lung bases are identified, right greater than left, favor atelectasis. The heart size is normal. Hepatobiliary: A 8.7 cm mixed hyper and hypodense lesion in the right lobe liver with Hounsfield unit of 56 in the hyperdense portion, not significant changed compared prior exam. Hemoperitoneum is noted around the liver unchanged compared to prior exam. Pelvic ascites is decreased compared to prior exam. The gallbladder is not seen. No significant abnormalities identified in the biliary tree. Pancreas: Unremarkable. No pancreatic ductal dilatation or surrounding inflammatory changes. Spleen: Normal in size without focal abnormality. Adrenals/Urinary Tract: Adrenal glands are unremarkable. Kidneys are normal, without renal calculi, focal lesion, or hydronephrosis. The bladder is decompressed limiting evaluation. Stomach/Bowel: Stomach is within normal limits. Appendix  appears normal. No evidence of bowel wall thickening, distention, or inflammatory changes. Vascular/Lymphatic: No significant vascular findings are present. No enlarged abdominal or pelvic lymph nodes. Reproductive: Uterus and bilateral adnexa are unremarkable. Other: None. Musculoskeletal: No acute or significant osseous findings. IMPRESSION: 8.7 cm mixed hyper and hypodense lesion in the right lobe liver without significant change since prior exam. Hemoperitoneum is noted around the liver unchanged compared prior exam. Pelvic ascites is decreased compared prior exam. Electronically Signed   By: Abelardo Diesel M.D.   On: 05/01/2018 09:31   Dg Chest 2 View  Result Date: 04/27/2018 CLINICAL DATA:  Fever. EXAM: CHEST - 2 VIEW COMPARISON:  None. FINDINGS: Mild cardiomegaly is noted. No pneumothorax is noted. Moderate left basilar opacity is noted concerning for pneumonia or atelectasis with associated pleural effusion. Mild right basilar atelectasis is noted. Bony thorax is unremarkable. IMPRESSION: Moderate left basilar opacity is noted concerning for pneumonia or atelectasis with associated pleural effusion. Mild right basilar atelectasis is noted. Electronically Signed   By: Marijo Conception, M.D.   On: 04/27/2018 14:26   Mr Abdomen W Wo Contrast  Result Date: 04/25/2018 CLINICAL DATA:  Liver lesion on CT, nontraumatic hemoperitoneum EXAM: MRI ABDOMEN WITHOUT AND WITH CONTRAST TECHNIQUE: Multiplanar multisequence MR imaging of the abdomen was performed both before and after the administration of intravenous contrast. CONTRAST:  51m MULTIHANCE GADOBENATE DIMEGLUMINE 529 MG/ML IV SOLN COMPARISON:  CT abdomen/pelvis dated 04/24/2018 FINDINGS: Motion degraded images. Lower chest: Mild patchy bilateral lower lobe opacities, left greater than right, likely reflecting atelectasis. Trace bilateral pleural effusions. Hepatobiliary: 9.1 x 7.8 x 5.7 cm mass with peripheral enhancement and intralesional hemorrhage centered  in segment 6. Associated perihepatic hemorrhage extending posteriorly outside the liver capsule into the hepatorenal fossa. The lesion itself cannot be easily characterized on MR given acute hemorrhage/rupture. Overall clinical picture favors rupture of a large hepatic adenoma, although cavernous hemangioma or FNH are also possibilities. Additional 3.7 x 2.4 cm lesion in segment 6 (series 8001/image 21), conspicuous on T2, with avid solid enhancement, which is overall favored to reflect FLa Valle Dominant 2.9 cm enhancing lesion in segment 4B (series 15001/image 15). Additional scattered 12-13 mm lesions in the right liver (series 15001/images 10 and 16) and in the left hepatic lobe (series 15001/images 28 and 36), not conspicuous on T2  but enhancing, favoring small hepatic adenomas. Gallbladder is within normal limits. No intrahepatic or extrahepatic ductal dilatation. Pancreas:  Within normal limits. Spleen:  Within normal limits. Adrenals/Urinary Tract:  Adrenal glands are within normal limits. Kidneys within normal limits.  No hydronephrosis. Stomach/Bowel: Stomach is within normal limits. Visualized bowel is unremarkable. Vascular/Lymphatic:  No evidence of abdominal aortic aneurysm. No suspicious abdominal lymphadenopathy. Other: Hemorrhage along the hepatorenal fossa (series 5001/image 24), likely related to rupture of the dominant liver lesion. Associated mild ascites in the right upper abdomen. Musculoskeletal: No focal osseous lesions. IMPRESSION: Dominant 9.1 cm mass centered in segment 6 with intralesional hemorrhage/rupture, incompletely characterized in the acute setting, although favoring rupture of a large hepatic adenoma (given the clinical setting) over cavernous hemangioma or FNH. Associated fluid/hemorrhage in the right upper abdomen and along the hepatorenal fossa. Additional 3.7 cm lesion in segment 6 which favors a benign FNH. Additional scattered enhancing lesions measuring up to 2.9 cm in segment  4B, favoring hepatic adenomas, benign. Electronically Signed   By: Julian Hy M.D.   On: 04/25/2018 07:17   US Abdomen Complete  Result Date: 04/24/2018 CLINICAL DATA:  27 year old female with abdominal pain, nausea vomiting. EXAM: ABDOMEN ULTRASOUND COMPLETE COMPARISON:  Abdominal radiograph dated 04/24/2018 FINDINGS: Evaluation is limited due to patient's body habitus. Gallbladder: No gallstones or wall thickening visualized. No sonographic Murphy sign noted by sonographer. Probable trace free fluid adjacent to the gallbladder. Common bile duct: Diameter: 3 mm Liver: No focal lesion identified. Within normal limits in parenchymal echogenicity. Portal vein is patent on color Doppler imaging with normal direction of blood flow towards the liver. IVC: No abnormality visualized. Pancreas: Poorly visualized. Spleen: Size and appearance within normal limits. Right Kidney: Length: 11.5 cm. The kidneys poorly visualized. No hydronephrosis. Left Kidney: Length: 10.6 cm.  The left kidney is poorly visualized. Abdominal aorta: No aneurysm visualized. Other findings: Small perihepatic free fluid. IMPRESSION: Small perihepatic free fluid. No other abnormality noted within the intention of the exam. Electronically Signed   By: Anner Crete M.D.   On: 04/24/2018 06:35   Ct Abdomen Pelvis W Contrast  Result Date: 04/24/2018 CLINICAL DATA:  Acute right upper quadrant abdominal pain. EXAM: CT ABDOMEN AND PELVIS WITH CONTRAST TECHNIQUE: Multidetector CT imaging of the abdomen and pelvis was performed using the standard protocol following bolus administration of intravenous contrast. CONTRAST:  142m ISOVUE-300 IOPAMIDOL (ISOVUE-300) INJECTION 61% COMPARISON:  Abdominal sonogram from earlier today. FINDINGS: Lower chest: No significant pulmonary nodules or acute consolidative airspace disease. Hepatobiliary: There is an 8.5 x 6.1 cm hypodense posterior right liver lobe mass (series 2/image 27) with irregular outer  contour, which is directly contiguous with small to moderate volume posterior right perihepatic acute hyperdense hemoperitoneum. No additional liver masses. Normal gallbladder with no radiopaque cholelithiasis. No biliary ductal dilatation. Pancreas: Normal, with no mass or duct dilation. Spleen: Normal size. No mass. Adrenals/Urinary Tract: Normal adrenals. Normal kidneys with no hydronephrosis and no renal mass. Normal bladder. Stomach/Bowel: Normal non-distended stomach. Normal caliber small bowel with no small bowel wall thickening. Normal appendix. Normal large bowel with no diverticulosis, large bowel wall thickening or pericolonic fat stranding. Vascular/Lymphatic: Normal caliber abdominal aorta. Patent portal, splenic, hepatic and renal veins. No pathologically enlarged lymph nodes in the abdomen or pelvis. Reproductive: Grossly normal uterus.  No adnexal mass. Other: No pneumoperitoneum. Small to moderate volume hemoperitoneum in the right paracolic gutter and pelvic cul-de-sac. Musculoskeletal: No aggressive appearing focal osseous lesions. No evidence of a fracture. IMPRESSION:  1. Large 8.5 cm hypodense indeterminate posterior right liver lobe mass, which is directly contiguous with small to moderate volume posterior right perihepatic acute hemoperitoneum. Differential includes acute hemorrhagic rupture of an underlying liver mass (such as an hepatic adenoma in a young female) into the perihepatic space versus traumatic liver laceration. Short-term follow-up MRI abdomen without and with IV contrast recommended for further characterization. 2. Small to moderate volume hemoperitoneum in the right paracolic gutter and pelvic cul-de-sac. These results were called by telephone at the time of interpretation on 04/24/2018 at 8:05 am to [facility], who verbally acknowledged these results. Electronically Signed   By: Ilona Sorrel M.D.   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 A total of Versed 3 mg and Fentanyl 100 mcg was administered intravenously. Moderate Sedation Time: 70 minutes. The patient's level of consciousness and vital signs were monitored continuously by radiology nursing throughout the procedure under my direct supervision. CONTRAST:  150 cc Isovue-300 FLUOROSCOPY TIME:  Fluoroscopy Time: 20 minutes 12 seconds (1,190 mGy). COMPLICATIONS: None immediate. PROCEDURE: Informed consent was obtained from the patient following explanation of the procedure, risks, benefits and alternatives. The patient understands, agrees and consents for the procedure. All questions were addressed. A time out was performed prior to the initiation of the procedure. Maximal barrier sterile technique utilized including caps, mask, sterile gowns, sterile gloves, large sterile drape, hand hygiene, and Betadine prep. The right groin was prepped and draped in a sterile fashion. 1% lidocaine was utilized for local anesthesia. Under sonographic guidance, a micropuncture needle was inserted into the right common femoral artery. Ultrasound documentation was obtained. The vessel was noted to be patent. This was up sized to a Bentson. A 5 French sheath was inserted. A cobra 2 catheter was advanced into the aorta. The Cobra was advanced over the Bentson wire into the celiac axis. Angiography was performed. The catheter was then advanced into the splenic artery. Arteriography was performed. Delayed images demonstrate patency of the portal vein. The catheter was retracted then advanced over a glidewire into the common hepatic artery. Angiography was performed. It was then advanced into the right hepatic artery. Subsequent  arteriography was performed. A Renegade high flow catheter was then advanced through the Cobra 2 catheter and into the right hepatic arterial circulation. Four branches of the right hepatic artery were selected and embolized with a combination of 500-700 micron embospheres and Gel-Foam slurry. The microcatheter was removed and final imaging through the Cobra 2 catheter was performed. The Cobra catheter was retracted to the right external iliac artery and femoral angiography was performed. The catheter was removed with the sheath left in place. An ExoSeal device was deployed for hemostasis. FINDINGS: Celiac arteriography delineates anatomy. Splenic arteriography with delayed images demonstrate patency of the splenic and portal veins. Hepatic arterial injection delineates anatomy throughout the liver. There is a large filling defect within the right lobe. Within the filling defect, there are abnormal vessels and pooling of contrast. These areas are worrisome for pseudoaneurysm formation or possibly active contrast extravasation. It may simply represent foci of tumor blush. These areas correspond to the abnormal mass with hemorrhage on the accompanying MRI of the abdomen. After embolization, the abnormal vasculature was no longer filling. A wedge-shaped defect within the inferior right lobe of the liver  was identified. This is consistent with successful embolization of the abnormal right lobe lesion. 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 A combination of embospheres and Gel-Foam slurry was utilized. Electronically Signed   By: Marybelle Killings M.D.   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 A total of Versed 3 mg and Fentanyl 100 mcg was administered intravenously. Moderate Sedation Time: 70 minutes. The patient's level of consciousness and vital signs were monitored continuously by radiology nursing throughout the procedure under my direct supervision. CONTRAST:  150 cc Isovue-300 FLUOROSCOPY TIME:  Fluoroscopy Time: 20 minutes 12 seconds (1,190 mGy). COMPLICATIONS: None immediate. PROCEDURE: Informed consent was obtained from the patient following explanation of the procedure, risks, benefits and alternatives. The patient understands, agrees and consents for the procedure. All questions were addressed. A time out was performed prior to the initiation of the procedure. Maximal barrier sterile technique utilized including caps, mask, sterile gowns, sterile gloves, large sterile drape, hand hygiene, and Betadine prep. The right groin was prepped and draped in a sterile fashion. 1% lidocaine was utilized for local anesthesia. Under sonographic guidance, a micropuncture needle was inserted into the right common femoral artery. Ultrasound documentation was obtained. The vessel was noted to be patent. This was up sized to a Bentson. A 5 French sheath was inserted. A cobra 2 catheter was advanced into the aorta. The Cobra was advanced over the Bentson wire into the celiac axis. Angiography was performed. The catheter was then advanced into the splenic artery. Arteriography was performed. Delayed images demonstrate patency of the portal vein. The catheter was retracted then advanced over a glidewire into the common hepatic artery. Angiography was performed. It was then advanced into the right hepatic artery. Subsequent arteriography was performed. A Renegade high flow catheter was then advanced through the Cobra 2 catheter and into the right hepatic arterial circulation. Four branches of the right hepatic artery were selected and embolized with a  combination of 500-700 micron embospheres and Gel-Foam slurry. The microcatheter was removed and final imaging through the Cobra 2 catheter was performed. The Cobra catheter was retracted to the right external iliac artery and femoral angiography was performed. The catheter was removed with the sheath left in place. An ExoSeal device was deployed for hemostasis. FINDINGS: Celiac arteriography delineates anatomy. Splenic arteriography with delayed images demonstrate patency of the splenic and portal veins. Hepatic arterial injection delineates anatomy throughout the liver. There is a large filling defect within the right lobe. Within the filling defect, there are abnormal vessels and pooling of contrast. These areas are worrisome for pseudoaneurysm formation or possibly active contrast extravasation. It may simply represent foci of tumor blush. These areas correspond to the abnormal mass with hemorrhage on the accompanying MRI of the abdomen. After embolization, the abnormal vasculature was no longer filling. A wedge-shaped defect within the inferior right lobe of the liver was identified. This is consistent with successful embolization of the abnormal right lobe lesion. 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 A combination of embospheres and Gel-Foam slurry was utilized. Electronically Signed   By: Marybelle Killings M.D.   On: 04/28/2018 09:02   Ir Angiogram Selective Each Additional Vessel  Result Date: 04/28/2018 INDICATION: Intraperitoneal hemorrhage from hepatic  adenoma EXAM: HEPATIC ARTERY EMBOLIZATION, VISCERAL ANGIOGRAPHY, ARTERIOGRAPHY THROUGH EXISTING VESSEL, ULTRASOUND GUIDANCE MEDICATIONS: None ANESTHESIA/SEDATION: Moderate (conscious) sedation was employed during this procedure. A total of Versed 3 mg and Fentanyl 100 mcg was administered intravenously. Moderate Sedation Time: 70 minutes. The patient's level of consciousness and vital signs were  monitored continuously by radiology nursing throughout the procedure under my direct supervision. CONTRAST:  150 cc Isovue-300 FLUOROSCOPY TIME:  Fluoroscopy Time: 20 minutes 12 seconds (1,190 mGy). COMPLICATIONS: None immediate. PROCEDURE: Informed consent was obtained from the patient following explanation of the procedure, risks, benefits and alternatives. The patient understands, agrees and consents for the procedure. All questions were addressed. A time out was performed prior to the initiation of the procedure. Maximal barrier sterile technique utilized including caps, mask, sterile gowns, sterile gloves, large sterile drape, hand hygiene, and Betadine prep. The right groin was prepped and draped in a sterile fashion. 1% lidocaine was utilized for local anesthesia. Under sonographic guidance, a micropuncture needle was inserted into the right common femoral artery. Ultrasound documentation was obtained. The vessel was noted to be patent. This was up sized to a Bentson. A 5 French sheath was inserted. A cobra 2 catheter was advanced into the aorta. The Cobra was advanced over the Bentson wire into the celiac axis. Angiography was performed. The catheter was then advanced into the splenic artery. Arteriography was performed. Delayed images demonstrate patency of the portal vein. The catheter was retracted then advanced over a glidewire into the common hepatic artery. Angiography was performed. It was then advanced into the right hepatic artery. Subsequent arteriography was performed. A Renegade high flow catheter was then advanced through the Cobra 2 catheter and into the right hepatic arterial circulation. Four branches of the right hepatic artery were selected and embolized with a combination of 500-700 micron embospheres and Gel-Foam slurry. The microcatheter was removed and final imaging through the Cobra 2 catheter was performed. The Cobra catheter was retracted to the right external iliac artery and  femoral angiography was performed. The catheter was removed with the sheath left in place. An ExoSeal device was deployed for hemostasis. FINDINGS: Celiac arteriography delineates anatomy. Splenic arteriography with delayed images demonstrate patency of the splenic and portal veins. Hepatic arterial injection delineates anatomy throughout the liver. There is a large filling defect within the right lobe. Within the filling defect, there are abnormal vessels and pooling of contrast. These areas are worrisome for pseudoaneurysm formation or possibly active contrast extravasation. It may simply represent foci of tumor blush. These areas correspond to the abnormal mass with hemorrhage on the accompanying MRI of the abdomen. After embolization, the abnormal vasculature was no longer filling. A wedge-shaped defect within the inferior right lobe of the liver was identified. This is consistent with successful embolization of the abnormal right lobe lesion. 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 A combination of embospheres and Gel-Foam slurry was utilized. Electronically Signed   By: Marybelle Killings M.D.   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 A total of Versed 3 mg and Fentanyl 100 mcg was administered intravenously. Moderate Sedation Time: 70 minutes. The patient's level of consciousness and vital signs were monitored continuously by radiology nursing throughout the procedure under my direct supervision. CONTRAST:  150  cc Isovue-300 FLUOROSCOPY TIME:  Fluoroscopy Time: 20 minutes 12 seconds (1,190 mGy). COMPLICATIONS: None immediate.  PROCEDURE: Informed consent was obtained from the patient following explanation of the procedure, risks, benefits and alternatives. The patient understands, agrees and consents for the procedure. All questions were addressed. A time out was performed prior to the initiation of the procedure. Maximal barrier sterile technique utilized including caps, mask, sterile gowns, sterile gloves, large sterile drape, hand hygiene, and Betadine prep. The right groin was prepped and draped in a sterile fashion. 1% lidocaine was utilized for local anesthesia. Under sonographic guidance, a micropuncture needle was inserted into the right common femoral artery. Ultrasound documentation was obtained. The vessel was noted to be patent. This was up sized to a Bentson. A 5 French sheath was inserted. A cobra 2 catheter was advanced into the aorta. The Cobra was advanced over the Bentson wire into the celiac axis. Angiography was performed. The catheter was then advanced into the splenic artery. Arteriography was performed. Delayed images demonstrate patency of the portal vein. The catheter was retracted then advanced over a glidewire into the common hepatic artery. Angiography was performed. It was then advanced into the right hepatic artery. Subsequent arteriography was performed. A Renegade high flow catheter was then advanced through the Cobra 2 catheter and into the right hepatic arterial circulation. Four branches of the right hepatic artery were selected and embolized with a combination of 500-700 micron embospheres and Gel-Foam slurry. The microcatheter was removed and final imaging through the Cobra 2 catheter was performed. The Cobra catheter was retracted to the right external iliac artery and femoral angiography was performed. The catheter was removed with the sheath left in place. An ExoSeal device was deployed for hemostasis. FINDINGS: Celiac arteriography delineates anatomy. Splenic arteriography with delayed images  demonstrate patency of the splenic and portal veins. Hepatic arterial injection delineates anatomy throughout the liver. There is a large filling defect within the right lobe. Within the filling defect, there are abnormal vessels and pooling of contrast. These areas are worrisome for pseudoaneurysm formation or possibly active contrast extravasation. It may simply represent foci of tumor blush. These areas correspond to the abnormal mass with hemorrhage on the accompanying MRI of the abdomen. After embolization, the abnormal vasculature was no longer filling. A wedge-shaped defect within the inferior right lobe of the liver was identified. This is consistent with successful embolization of the abnormal right lobe lesion. 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 A combination of embospheres and Gel-Foam slurry was utilized. Electronically Signed   By: Marybelle Killings M.D.   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 A total of Versed 3 mg and Fentanyl 100 mcg was administered intravenously. Moderate Sedation Time: 70 minutes. The patient's level of consciousness and vital signs were monitored continuously by radiology nursing throughout the procedure under my direct supervision. CONTRAST:  150 cc Isovue-300 FLUOROSCOPY TIME:  Fluoroscopy Time: 20 minutes 12 seconds (1,190 mGy). COMPLICATIONS: None immediate. PROCEDURE: Informed consent was obtained from the patient following explanation of the procedure, risks, benefits and alternatives. The patient understands, agrees and consents for the procedure. All questions were addressed. A time  out was performed prior to the initiation of the procedure. Maximal barrier sterile technique utilized including caps,  mask, sterile gowns, sterile gloves, large sterile drape, hand hygiene, and Betadine prep. The right groin was prepped and draped in a sterile fashion. 1% lidocaine was utilized for local anesthesia. Under sonographic guidance, a micropuncture needle was inserted into the right common femoral artery. Ultrasound documentation was obtained. The vessel was noted to be patent. This was up sized to a Bentson. A 5 French sheath was inserted. A cobra 2 catheter was advanced into the aorta. The Cobra was advanced over the Bentson wire into the celiac axis. Angiography was performed. The catheter was then advanced into the splenic artery. Arteriography was performed. Delayed images demonstrate patency of the portal vein. The catheter was retracted then advanced over a glidewire into the common hepatic artery. Angiography was performed. It was then advanced into the right hepatic artery. Subsequent arteriography was performed. A Renegade high flow catheter was then advanced through the Cobra 2 catheter and into the right hepatic arterial circulation. Four branches of the right hepatic artery were selected and embolized with a combination of 500-700 micron embospheres and Gel-Foam slurry. The microcatheter was removed and final imaging through the Cobra 2 catheter was performed. The Cobra catheter was retracted to the right external iliac artery and femoral angiography was performed. The catheter was removed with the sheath left in place. An ExoSeal device was deployed for hemostasis. FINDINGS: Celiac arteriography delineates anatomy. Splenic arteriography with delayed images demonstrate patency of the splenic and portal veins. Hepatic arterial injection delineates anatomy throughout the liver. There is a large filling defect within the right lobe. Within the filling defect, there are abnormal vessels and  pooling of contrast. These areas are worrisome for pseudoaneurysm formation or possibly active contrast extravasation. It may simply represent foci of tumor blush. These areas correspond to the abnormal mass with hemorrhage on the accompanying MRI of the abdomen. After embolization, the abnormal vasculature was no longer filling. A wedge-shaped defect within the inferior right lobe of the liver was identified. This is consistent with successful embolization of the abnormal right lobe lesion. 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 A combination of embospheres and Gel-Foam slurry was utilized. Electronically Signed   By: Marybelle Killings M.D.   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 A total of Versed 3 mg and Fentanyl 100 mcg was administered intravenously. Moderate Sedation Time: 70 minutes. The patient's level of consciousness and vital signs were monitored continuously by radiology nursing throughout the procedure under my direct supervision. CONTRAST:  150 cc Isovue-300 FLUOROSCOPY TIME:  Fluoroscopy Time: 20 minutes 12 seconds (1,190 mGy). COMPLICATIONS: None immediate. PROCEDURE: Informed consent was obtained from the patient following explanation of the procedure, risks, benefits and alternatives. The patient understands, agrees and consents for the procedure. All questions were addressed. A time out was performed prior to the initiation of the procedure. Maximal barrier sterile technique utilized including caps, mask, sterile gowns, sterile gloves, large sterile drape, hand hygiene, and Betadine prep. The right groin was  prepped and draped in a sterile fashion. 1% lidocaine was utilized for local anesthesia. Under sonographic guidance, a micropuncture needle was inserted into the right common femoral artery. Ultrasound documentation was obtained. The vessel was noted to be patent. This was up sized to a Bentson. A 5 [place]  sheath was inserted. A cobra 2 catheter was advanced into the aorta. The Cobra was advanced over the Bentson wire into the celiac axis. Angiography was performed. The catheter was then advanced into the splenic artery. Arteriography was performed. Delayed images demonstrate patency of the portal vein. The catheter was retracted then advanced over a glidewire into the common hepatic artery. Angiography was performed. It was then advanced into the right hepatic artery. Subsequent arteriography was performed. A Renegade high flow catheter was then advanced through the Cobra 2 catheter and into the right hepatic arterial circulation. Four branches of the right hepatic artery were selected and embolized with a combination of 500-700 micron embospheres and Gel-Foam slurry. The microcatheter was removed and final imaging through the Cobra 2 catheter was performed. The Cobra catheter was retracted to the right external iliac artery and femoral angiography was performed. The catheter was removed with the sheath left in place. An ExoSeal device was deployed for hemostasis. FINDINGS: Celiac arteriography delineates anatomy. Splenic arteriography with delayed images demonstrate patency of the splenic and portal veins. Hepatic arterial injection delineates anatomy throughout the liver. There is a large filling defect within the right lobe. Within the filling defect, there are abnormal vessels and pooling of contrast. These areas are worrisome for pseudoaneurysm formation or possibly active contrast extravasation. It may simply represent foci of tumor blush. These areas correspond to the abnormal mass with hemorrhage on the  accompanying MRI of the abdomen. After embolization, the abnormal vasculature was no longer filling. A wedge-shaped defect within the inferior right lobe of the liver was identified. This is consistent with successful embolization of the abnormal right lobe lesion. 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 A combination of embospheres and Gel-Foam slurry was utilized. Electronically Signed   By: Marybelle Killings M.D.   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 A total of Versed 3 mg and Fentanyl 100 mcg was administered intravenously. Moderate Sedation Time: 70 minutes. The patient's level of consciousness and vital signs were monitored continuously by radiology nursing throughout the procedure under my direct supervision. CONTRAST:  150 cc Isovue-300 FLUOROSCOPY TIME:  Fluoroscopy Time: 20 minutes 12 seconds (1,190 mGy). COMPLICATIONS: None immediate. PROCEDURE: Informed consent was obtained from the patient following explanation of the procedure, risks, benefits and alternatives. The patient understands, agrees and consents for the procedure. All questions were addressed. A time out was performed prior to the initiation of the procedure. Maximal barrier sterile technique utilized including caps, mask, sterile gowns, sterile gloves, large sterile drape, hand hygiene, and Betadine prep. The right groin was prepped and draped in a sterile fashion. 1% lidocaine was utilized for local anesthesia. Under sonographic guidance, a micropuncture needle was inserted into the right common femoral artery. Ultrasound documentation was obtained. The  vessel was noted to be patent. This was up sized to a Bentson. A 5 French sheath was inserted. A cobra 2 catheter was advanced into the aorta. The Cobra was advanced over the Bentson wire into the celiac axis. Angiography was performed. The catheter was then advanced into the splenic artery. Arteriography was performed. Delayed images demonstrate patency of the portal vein. The catheter was retracted then advanced over a glidewire into the common hepatic artery. Angiography was performed. It was  then advanced into the right hepatic artery. Subsequent arteriography was performed. A Renegade high flow catheter was then advanced through the Cobra 2 catheter and into the right hepatic arterial circulation. Four branches of the right hepatic artery were selected and embolized with a combination of 500-700 micron embospheres and Gel-Foam slurry. The microcatheter was removed and final imaging through the Cobra 2 catheter was performed. The Cobra catheter was retracted to the right external iliac artery and femoral angiography was performed. The catheter was removed with the sheath left in place. An ExoSeal device was deployed for hemostasis. FINDINGS: Celiac arteriography delineates anatomy. Splenic arteriography with delayed images demonstrate patency of the splenic and portal veins. Hepatic arterial injection delineates anatomy throughout the liver. There is a large filling defect within the right lobe. Within the filling defect, there are abnormal vessels and pooling of contrast. These areas are worrisome for pseudoaneurysm formation or possibly active contrast extravasation. It may simply represent foci of tumor blush. These areas correspond to the abnormal mass with hemorrhage on the accompanying MRI of the abdomen. After embolization, the abnormal vasculature was no longer filling. A wedge-shaped defect within the inferior right lobe of the liver was identified. This is consistent with successful embolization of the  abnormal right lobe lesion. 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 A combination of embospheres and Gel-Foam slurry was utilized. Electronically Signed   By: Marybelle Killings M.D.   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 A total of Versed 3 mg and Fentanyl 100 mcg was administered intravenously. Moderate Sedation Time: 70 minutes. The patient's level of consciousness and vital signs were monitored continuously by radiology nursing throughout the procedure under my direct supervision. CONTRAST:  150 cc Isovue-300 FLUOROSCOPY TIME:  Fluoroscopy Time: 20 minutes 12 seconds (1,190 mGy). COMPLICATIONS: None immediate. PROCEDURE: Informed consent was obtained from the patient following explanation of the procedure, risks, benefits and alternatives. The patient understands, agrees and consents for the procedure. All questions were addressed. A time out was performed prior to the initiation of the procedure. Maximal barrier sterile technique utilized including caps, mask, sterile gowns, sterile gloves, large sterile drape, hand hygiene, and Betadine prep. The right groin was prepped and draped in a sterile fashion. 1% lidocaine was utilized for local anesthesia. Under sonographic guidance, a micropuncture needle was inserted into the right common femoral artery. Ultrasound documentation was obtained. The vessel was noted to be patent. This was up sized to a Bentson. A 5 French sheath was inserted. A cobra 2 catheter was advanced into the aorta. The Cobra was advanced over the Bentson wire into the celiac axis. Angiography was performed. The  catheter was then advanced into the splenic artery. Arteriography was performed. Delayed images demonstrate patency of the portal vein. The catheter was retracted then advanced over a glidewire into the common hepatic artery. Angiography was performed. It was then advanced into the right hepatic artery. Subsequent arteriography was performed. A Renegade high flow catheter was then advanced through the Cobra 2 catheter and into the right hepatic arterial circulation. Four branches of the right hepatic artery were selected and embolized with a combination of 500-700 micron embospheres and Gel-Foam slurry. The microcatheter was removed and final imaging through the Cobra 2 catheter was performed.  The Cobra catheter was retracted to the right external iliac artery and femoral angiography was performed. The catheter was removed with the sheath left in place. An ExoSeal device was deployed for hemostasis. FINDINGS: Celiac arteriography delineates anatomy. Splenic arteriography with delayed images demonstrate patency of the splenic and portal veins. Hepatic arterial injection delineates anatomy throughout the liver. There is a large filling defect within the right lobe. Within the filling defect, there are abnormal vessels and pooling of contrast. These areas are worrisome for pseudoaneurysm formation or possibly active contrast extravasation. It may simply represent foci of tumor blush. These areas correspond to the abnormal mass with hemorrhage on the accompanying MRI of the abdomen. After embolization, the abnormal vasculature was no longer filling. A wedge-shaped defect within the inferior right lobe of the liver was identified. This is consistent with successful embolization of the abnormal right lobe lesion. 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 A combination of embospheres and Gel-Foam slurry was utilized. Electronically Signed   By: Marybelle Killings M.D.   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 A total of Versed 3 mg and Fentanyl 100 mcg was administered intravenously. Moderate Sedation Time: 70 minutes. The patient's level of consciousness and vital signs were monitored continuously by radiology nursing throughout the procedure under my direct supervision. CONTRAST:  150 cc Isovue-300 FLUOROSCOPY TIME:  Fluoroscopy Time: 20 minutes 12 seconds (1,190 mGy). COMPLICATIONS: None immediate. PROCEDURE: Informed consent was obtained from the patient following explanation of the procedure, risks, benefits and alternatives. The patient understands, agrees and consents for the procedure. All questions were addressed. A time out was performed prior to the initiation of the procedure. Maximal barrier sterile technique utilized including caps, mask, sterile gowns, sterile gloves, large sterile drape, hand hygiene, and Betadine prep. The right groin was prepped and draped in a sterile fashion. 1% lidocaine was utilized for local anesthesia. Under sonographic guidance, a micropuncture needle was inserted into the right common femoral artery. Ultrasound documentation was obtained. The vessel was noted to be patent. This was up sized to a Bentson. A 5 French sheath was inserted. A cobra 2 catheter was advanced into the aorta. The Cobra was advanced over the Bentson wire into the celiac axis. Angiography was performed. The catheter was then advanced into the splenic artery. Arteriography was performed. Delayed images demonstrate patency of the portal vein. The catheter was retracted then advanced over a glidewire into the common hepatic artery. Angiography was performed.  It was then advanced into the right hepatic artery. Subsequent arteriography was performed. A Renegade high flow catheter was then advanced through the Cobra 2 catheter and into the right hepatic arterial circulation. Four branches of the right hepatic artery were selected and embolized with a combination of 500-700 micron embospheres and Gel-Foam slurry. The microcatheter was removed and final imaging through the Cobra 2 catheter was performed. The Cobra catheter was retracted to the right external iliac artery and femoral angiography was performed. The catheter was removed with the sheath left in place. An ExoSeal device was deployed for hemostasis. FINDINGS: Celiac arteriography delineates anatomy. Splenic arteriography with delayed images demonstrate patency of the splenic and portal veins. Hepatic arterial injection delineates anatomy throughout the liver. There is a large filling defect within  the right lobe. Within the filling defect, there are abnormal vessels and pooling of contrast. These areas are worrisome for pseudoaneurysm formation or possibly active contrast extravasation. It may simply represent foci of tumor blush. These areas correspond to the abnormal mass with hemorrhage on the accompanying MRI of the abdomen. After embolization, the abnormal vasculature was no longer filling. A wedge-shaped defect within the inferior right lobe of the liver was identified. This is consistent with successful embolization of the abnormal right lobe lesion. 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 A combination of embospheres and Gel-Foam slurry was utilized. Electronically Signed   By: Marybelle Killings M.D.   On: 04/28/2018 09:01   Dg Abd 2 Views  Result Date: 04/24/2018 CLINICAL DATA:  27 y/o  F; mid abdominal pain, nausea, vomiting. EXAM: ABDOMEN - 2 VIEW COMPARISON:  None. FINDINGS: Nonobstructive bowel gas pattern. Large volume of stool in the colon may  represent constipation. Bones are unremarkable. No pneumoperitoneum. IMPRESSION: Nonobstructive bowel gas pattern. Large volume of stool in the colon may represent constipation. Electronically Signed   By: Kristine Garbe M.D.   On: 04/24/2018 05:42   Ir Embo Art  Lawson Fiscal Hemorr Lymph [place] Guide Roadmapping  Addendum Date: 04/28/2018   ADDENDUM REPORT: 04/28/2018 10:56 ADDENDUM: There were form vessels in the right hepatic arterial tree that were selected. These are all fifth order. Electronically Signed   By: Marybelle Killings M.D.   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 A total of Versed 3 mg and Fentanyl 100 mcg was administered intravenously. Moderate Sedation Time: 70 minutes. The patient's level of consciousness and vital signs were monitored continuously by radiology nursing throughout the procedure under my direct supervision. CONTRAST:  150 cc Isovue-300 FLUOROSCOPY TIME:  Fluoroscopy Time: 20 minutes 12 seconds (1,190 mGy). COMPLICATIONS: None immediate. PROCEDURE: Informed consent was obtained from the patient following explanation of the procedure, risks, benefits and alternatives. The patient understands, agrees and consents for the procedure. All questions were addressed. A time out was performed prior to the initiation of the procedure. Maximal barrier sterile technique utilized including caps, mask, sterile gowns, sterile gloves, large sterile drape, hand hygiene, and Betadine prep. The right groin was prepped and draped in a sterile fashion. 1% lidocaine was utilized for local anesthesia. Under sonographic guidance, a micropuncture needle was inserted into the right common femoral artery. Ultrasound  documentation was obtained. The vessel was noted to be patent. This was up sized to a Bentson. A 5 French sheath was inserted. A cobra 2 catheter was advanced into the aorta. The Cobra was advanced over the Bentson wire into the celiac axis. Angiography was performed. The catheter was then advanced into the splenic artery. Arteriography was performed. Delayed images demonstrate patency of the portal vein. The catheter was retracted then advanced over a glidewire into the common hepatic artery. Angiography was performed. It was then advanced into the right hepatic artery. Subsequent arteriography was performed. A Renegade high flow catheter was then advanced through the Cobra 2 catheter and into the right hepatic arterial circulation. Four branches of the right hepatic artery were selected and embolized with a combination of 500-700 micron embospheres and Gel-Foam slurry. The microcatheter was removed and final imaging through the Cobra 2 catheter was performed. The Cobra catheter  was retracted to the right external iliac artery and femoral angiography was performed. The catheter was removed with the sheath left in place. An ExoSeal device was deployed for hemostasis. FINDINGS: Celiac arteriography delineates anatomy. Splenic arteriography with delayed images demonstrate patency of the splenic and portal veins. Hepatic arterial injection delineates anatomy throughout the liver. There is a large filling defect within the right lobe. Within the filling defect, there are abnormal vessels and pooling of contrast. These areas are worrisome for pseudoaneurysm formation or possibly active contrast extravasation. It may simply represent foci of tumor blush. These areas correspond to the abnormal mass with hemorrhage on the accompanying MRI of the abdomen. After embolization, the abnormal vasculature was no longer filling. A wedge-shaped defect within the inferior right lobe of the liver was identified. This is consistent  with successful embolization of the abnormal right lobe lesion. 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 A combination of embospheres and Gel-Foam slurry was utilized. Electronically Signed: By: Marybelle Killings M.D. On: 04/28/2018 09:03    Lab Data:  CBC: Recent Labs  Lab 04/27/18 0443 04/28/18 0403 04/29/18 0713 04/30/18 0425 05/01/18 0418  WBC 10.3 12.2* 12.1* 13.3* 12.5*  HGB 8.1* 8.2* 8.8* 7.9* 7.6*  HCT 24.9* 26.1* 27.5* 24.4* 23.9*  MCV 91.2 90.0 91.7 90.7 91.6  PLT 217 289 341 399 518*   Basic Metabolic Panel: Recent Labs  Lab 04/25/18 0956 05/01/18 0418  NA 138 138  K 3.6 3.4*  CL 104 104  CO2 26 24  GLUCOSE 125* 115*  BUN 6 5*  CREATININE 0.78 0.78  CALCIUM 8.5* 8.6*   GFR: CrCl cannot be calculated (Unknown ideal weight.). Liver Function Tests: Recent Labs  Lab 04/25/18 0956  AST 273*  ALT 420*  ALKPHOS 63  BILITOT 0.4  PROT 6.3*  ALBUMIN 3.6   No results for input(s): LIPASE, AMYLASE in the last 168 hours. No results for input(s): AMMONIA in the last 168 hours. Coagulation Profile: No results for input(s): INR, PROTIME in the last 168 hours. Cardiac Enzymes: No results for input(s): CKTOTAL, CKMB, CKMBINDEX, TROPONINI in the last 168 hours. BNP (last 3 results) No results for input(s): PROBNP in the last 8760 hours. HbA1C: No results for input(s): HGBA1C in the last 72 hours. CBG: Recent Labs  Lab 04/26/18 1239 04/26/18 1717 04/26/18 2130 04/27/18 0825 04/27/18 1845  GLUCAP 125* 113* 99 124* 84   Lipid Profile: No results for input(s): CHOL, HDL, LDLCALC, TRIG, CHOLHDL, LDLDIRECT in the last 72 hours. Thyroid Function Tests: No results for input(s): TSH, T4TOTAL, FREET4, T3FREE, THYROIDAB in the last 72 hours. Anemia Panel: No results for input(s): VITAMINB12, FOLATE, FERRITIN, TIBC, IRON, RETICCTPCT in the last 72 hours. Urine analysis:    Component Value Date/Time   COLORURINE  YELLOW 04/27/2018 [place] 04/27/2018 1227   LABSPEC 1.020 04/27/2018 1227   PHURINE 6.0 04/27/2018 1227   GLUCOSEU NEGATIVE 04/27/2018 1227   HGBUR NEGATIVE 04/27/2018 1227   BILIRUBINUR NEGATIVE 04/27/2018 1227   KETONESUR 5 (A) 04/27/2018 1227   PROTEINUR NEGATIVE 04/27/2018 1227   NITRITE NEGATIVE 04/27/2018 1227   LEUKOCYTESUR NEGATIVE 04/27/2018 1227     Kamoni Gentles M.D. [facility] 05/01/2018, 11:24 AM  Pager: [phone] Between 7am to 7pm - call Pager - [phone]  After 7pm go to [email or web address] - password [email or web address]  Call night coverage person covering after 7pm

## 2018-05-01 NOTE — Progress Notes (Signed)
Called to patient room for some skin irritation on her butt that was itching.  Patient has an irritated spot on left cheek, lotion applied and benadryl requested from the on-call physician.  Will continue to monitor.

## 2018-05-01 NOTE — Discharge Summary (Signed)
TRANSFER SUMMARY   Latoya Kramer [id number]:[id number]  is a 27 y.o. 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 Allergy   . Anemia   . Eczema   . Hemoperitoneum, nontraumatic   . Liver mass     Past Surgical History:  Procedure Laterality Date  . EYE SURGERY    . IR ANGIOGRAM SELECTIVE EACH ADDITIONAL VESSEL  04/27/2018  . IR ANGIOGRAM SELECTIVE EACH ADDITIONAL VESSEL  04/27/2018  . IR ANGIOGRAM SELECTIVE EACH ADDITIONAL VESSEL  04/27/2018  . IR ANGIOGRAM SELECTIVE EACH ADDITIONAL VESSEL  04/27/2018  . IR ANGIOGRAM SELECTIVE EACH ADDITIONAL VESSEL  04/27/2018  . IR ANGIOGRAM SELECTIVE EACH ADDITIONAL VESSEL  04/27/2018  . IR ANGIOGRAM SELECTIVE EACH ADDITIONAL VESSEL  04/27/2018  . IR ANGIOGRAM VISCERAL SELECTIVE  04/27/2018  . IR EMBO ART  VEN HEMORR LYMPH EXTRAV  INC GUIDE ROADMAPPING  04/27/2018  . IR US GUIDE VASC ACCESS RIGHT  04/27/2018    Consults   Interventional radiology Surgery- Dr Redmond Pulling   Brief HPI 27yo F Jehovah's Witness w/ a hx of HTN and obesity who presented to the ED w/ abdominal pain. CT abdom noted a posterior right liver lobe mass with moderate volume perihepatic acute hemoperitoneum    Hospital Course    Hemoperitoneum, nontraumatic -Patient had presented with abdominal pain, CT abdomen 5/5 showed large 8.5 cm hypodense indeterminate posterior right liver lobe mass, directly contiguous with small to  moderate posterior right perihepatic acute hemoperitoneum.  Patient met Sirs criteria -General surgery was consulted and recommended interventional radiology embolization as she carries high risk of complications with the mortality and morbidity.  Dr. Redmond Pulling (surgery) discussed in detail with patient and mother and she will not accept any blood products even in the setting of major bleed or shock.  Will accept blood alternatives.   Patient is Jehovah's Witness. -IR was consulted and patient underwent IR embolization on 5/8, please see #2      Anemia: Due to intra-abdominal bleeding as #1, Jehovah's Witness - Patient will not accept blood transfusions -Patient received Aranesp on 04/26/2018, IV Venofer on 5/7 and 04/30/2018. - H&H trending down 7.6, repeat CT abdomen today showed 8.7 cm mixed hyper and hypodense lesion in the right lobe liver with no significant change, unchanged hemoperitoneum around the liver pelvic ascites is decreased.  No bleeding or drainage on the dressing in the right groin.  Patient has some discomfort on palpation. -Discussed with interventional radiology, Dr Earleen Newport, recommended CT angiogram of the abdomen with and without contrast if patient is continuing to have bleeding.    I discussed with patient and mother who at this point requested that she be transferred to tertiary care center as was recommended initially, in case of worsening and needs surgery.  -Given hemoglobin trending down, tachycardia, borderline BP this morning, placed on stepdown status. -Called [facility] transfer line, patient was accepted by Dr Laddie Aquas, surgery.   Hypotension with tachycardia: Patient has underlying history of hypertension  - Triamterene HCTZ has been  held, patient was placed on beta-blocker, BP currently soft likely due to anemia, ?  Bleeding    Obesity -Recommended diet and weight control  Fever/pneumonia -Patient was noticed to have fever on 5/7. Patient had reported  recent bronchitis, chest x-ray showed possible left basilar pneumonia - Low-grade temp today 100 F. - continue Zithromax and IV Rocephin, day #5 today, WBC is trending down  -IV negative.  Blood cultures has been negative. UA negative     Today   Subjective:   Latoya Kramer today seen and examined.  Earlier this morning tachycardiac and soft borderline BP.  Low-grade temp this morning.  However hemoglobin trending down 7.6 today.  Patient and mother concerned about the hemoglobin trending down.  Objective:   Blood pressure (!) 131/95, pulse 79, temperature 97.9 F (36.6 C), temperature source Oral, resp. rate 18, weight 95.3 kg (210 lb), last menstrual period 04/24/2018, SpO2 95 %. No intake or output data in the 24 hours ending 05/01/18 1257  Exam Awake Alert, Oriented x3, No new F.N deficits, Normal affect HEENT:EOMI, PERLA Neck: Supple,no JVD, No cervical lymphadenopathy appreciated.  Chest: clear to ausculatation, no wheezing, rales and rhonchi CVS: RRR,No murmurs, rubs or gallops Abdomen: Normal bowel sounds, Soft, mildly tender in the right groin area , No organomegaly, No rebound or guarding Extremeties: No Cyanosis, Clubbing or edema, No new Rash or bruise Skin: No bleeding or drainage from the right groin dressing  Data Review  CBC w Diff:  Lab Results  Component Value Date   WBC 12.5 (H) 05/01/2018   HGB 7.6 (L) 05/01/2018   HCT 23.9 (L) 05/01/2018   PLT 432 (H) 05/01/2018   LYMPHOPCT 16 04/24/2018   MONOPCT 5 04/24/2018   EOSPCT 0 04/24/2018   BASOPCT 0 04/24/2018   CMP:  Lab Results  Component Value Date   NA 138 05/01/2018   K 3.4 (L) 05/01/2018   CL 104 05/01/2018   CO2 24 05/01/2018   BUN 5 (L) 05/01/2018   CREATININE 0.78 05/01/2018   PROT 6.3 (L) 04/25/2018   ALBUMIN 3.6 04/25/2018   BILITOT 0.4 04/25/2018   ALKPHOS 63 04/25/2018   AST 273 (H) 04/25/2018   ALT 420 (H) 04/25/2018  .  Significant Tests   Significant Diagnostic Studies:   Mr Abdomen W Wo Contrast  Result Date: 04/25/2018 CLINICAL DATA:  Liver lesion on CT, nontraumatic hemoperitoneum EXAM: MRI ABDOMEN WITHOUT AND WITH CONTRAST TECHNIQUE: Multiplanar multisequence MR imaging of the abdomen was performed both before and after the administration of intravenous contrast. CONTRAST:  37m MULTIHANCE GADOBENATE DIMEGLUMINE 529 MG/ML IV SOLN COMPARISON:  CT abdomen/pelvis dated 04/24/2018 FINDINGS: Motion degraded images. Lower chest: Mild patchy bilateral lower lobe opacities, left greater than right, likely reflecting atelectasis. Trace bilateral pleural effusions. Hepatobiliary: 9.1 x 7.8 x 5.7 cm mass with peripheral enhancement and intralesional hemorrhage centered in segment 6. Associated perihepatic hemorrhage extending posteriorly outside the liver capsule into the hepatorenal fossa. The lesion itself cannot be easily characterized on MR given acute hemorrhage/rupture. Overall clinical picture favors rupture of a large hepatic adenoma, although cavernous hemangioma or FNH are also possibilities. Additional 3.7 x 2.4 cm lesion in segment 6 (series 8001/image 21), conspicuous on T2, with avid solid enhancement, which is overall favored to reflect FIvy Dominant 2.9 cm enhancing lesion in segment 4B (series 15001/image 15). Additional scattered 12-13 mm lesions in the right liver (series 15001/images 10 and 16) and in the left hepatic lobe (series 15001/images 28 and 36), not conspicuous  on T2 but enhancing, favoring small hepatic adenomas. Gallbladder is within normal limits. No intrahepatic or extrahepatic ductal dilatation. Pancreas:  Within normal limits. Spleen:  Within normal limits. Adrenals/Urinary Tract:  Adrenal glands are within normal limits. Kidneys within normal limits.  No hydronephrosis. Stomach/Bowel: Stomach is within normal limits. Visualized bowel is unremarkable. Vascular/Lymphatic:  No evidence of abdominal aortic aneurysm. No suspicious abdominal  lymphadenopathy. Other: Hemorrhage along the hepatorenal fossa (series 5001/image 24), likely related to rupture of the dominant liver lesion. Associated mild ascites in the right upper abdomen. Musculoskeletal: No focal osseous lesions. IMPRESSION: Dominant 9.1 cm mass centered in segment 6 with intralesional hemorrhage/rupture, incompletely characterized in the acute setting, although favoring rupture of a large hepatic adenoma (given the clinical setting) over cavernous hemangioma or FNH. Associated fluid/hemorrhage in the right upper abdomen and along the hepatorenal fossa. Additional 3.7 cm lesion in segment 6 which favors a benign FNH. Additional scattered enhancing lesions measuring up to 2.9 cm in segment 4B, favoring hepatic adenomas, benign. Electronically Signed   By: Julian Hy M.D.   On: 04/25/2018 07:17   US Abdomen Complete  Result Date: 04/24/2018 CLINICAL DATA:  27 year old female with abdominal pain, nausea vomiting. EXAM: ABDOMEN ULTRASOUND COMPLETE COMPARISON:  Abdominal radiograph dated 04/24/2018 FINDINGS: Evaluation is limited due to patient's body habitus. Gallbladder: No gallstones or wall thickening visualized. No sonographic Murphy sign noted by sonographer. Probable trace free fluid adjacent to the gallbladder. Common bile duct: Diameter: 3 mm Liver: No focal lesion identified. Within normal limits in parenchymal echogenicity. Portal vein is patent on color Doppler imaging with normal direction of blood flow towards the liver. IVC: No abnormality visualized. Pancreas: Poorly visualized. Spleen: Size and appearance within normal limits. Right Kidney: Length: 11.5 cm. The kidneys poorly visualized. No hydronephrosis. Left Kidney: Length: 10.6 cm.  The left kidney is poorly visualized. Abdominal aorta: No aneurysm visualized. Other findings: Small perihepatic free fluid. IMPRESSION: Small perihepatic free fluid. No other abnormality noted within the intention of the exam.  Electronically Signed   By: Anner Crete M.D.   On: 04/24/2018 06:35   Ct Abdomen Pelvis W Contrast  Result Date: 04/24/2018 CLINICAL DATA:  Acute right upper quadrant abdominal pain. EXAM: CT ABDOMEN AND PELVIS WITH CONTRAST TECHNIQUE: Multidetector CT imaging of the abdomen and pelvis was performed using the standard protocol following bolus administration of intravenous contrast. CONTRAST:  148m ISOVUE-300 IOPAMIDOL (ISOVUE-300) INJECTION 61% COMPARISON:  Abdominal sonogram from earlier today. FINDINGS: Lower chest: No significant pulmonary nodules or acute consolidative airspace disease. Hepatobiliary: There is an 8.5 x 6.1 cm hypodense posterior right liver lobe mass (series 2/image 27) with irregular outer contour, which is directly contiguous with small to moderate volume posterior right perihepatic acute hyperdense hemoperitoneum. No additional liver masses. Normal gallbladder with no radiopaque cholelithiasis. No biliary ductal dilatation. Pancreas: Normal, with no mass or duct dilation. Spleen: Normal size. No mass. Adrenals/Urinary Tract: Normal adrenals. Normal kidneys with no hydronephrosis and no renal mass. Normal bladder. Stomach/Bowel: Normal non-distended stomach. Normal caliber small bowel with no small bowel wall thickening. Normal appendix. Normal large bowel with no diverticulosis, large bowel wall thickening or pericolonic fat stranding. Vascular/Lymphatic: Normal caliber abdominal aorta. Patent portal, splenic, hepatic and renal veins. No pathologically enlarged lymph nodes in the abdomen or pelvis. Reproductive: Grossly normal uterus.  No adnexal mass. Other: No pneumoperitoneum. Small to moderate volume hemoperitoneum in the right paracolic gutter and pelvic cul-de-sac. Musculoskeletal: No aggressive appearing focal osseous lesions. No evidence of a  fracture. IMPRESSION: 1. Large 8.5 cm hypodense indeterminate posterior right liver lobe mass, which is directly contiguous with small  to moderate volume posterior right perihepatic acute hemoperitoneum. Differential includes acute hemorrhagic rupture of an underlying liver mass (such as an hepatic adenoma in a young female) into the perihepatic space versus traumatic liver laceration. Short-term follow-up MRI abdomen without and with IV contrast recommended for further characterization. 2. Small to moderate volume hemoperitoneum in the right paracolic gutter and pelvic cul-de-sac. These results were called by telephone at the time of interpretation on 04/24/2018 at 8:05 am to [facility], who verbally acknowledged these results. Electronically Signed   By: Ilona Sorrel M.D.   On: 04/24/2018 08:07   Dg Abd 2 Views  Result Date: 04/24/2018 CLINICAL DATA:  27 y/o  F; mid abdominal pain, nausea, vomiting. EXAM: ABDOMEN - 2 VIEW COMPARISON:  None. FINDINGS: Nonobstructive bowel gas pattern. Large volume of stool in the colon may represent constipation. Bones are unremarkable. No pneumoperitoneum. IMPRESSION: Nonobstructive bowel gas pattern. Large volume of stool in the colon may represent constipation. Electronically Signed   By: Kristine Garbe M.D.   On: 04/24/2018 05:42       Scheduled Meds: . [START ON 05/03/2018] Darbepoetin Alfa  200 mcg Subcutaneous Q Tue-1800  . diphenhydrAMINE  25 mg Oral QHS  . feeding supplement  1 Container Oral TID BM  . folic acid  1 mg Oral Daily  . metoprolol tartrate  50 mg Oral BID  . senna-docusate  2 tablet Oral QHS   Continuous Infusions: . azithromycin Stopped (04/30/18 1951)  . cefTRIAXone (ROCEPHIN)  IV Stopped (04/30/18 1818)       The risks and  Benefits of transporting including disability, death  were discussed with the patient or health power of attorney and were agreeable to the plan and further management.  Total Time in preparing paper work, todays exam and data evaluation: 45 minutes  Signed: Estill Cotta M.D. [facility] 05/01/2018, 12:57 PM

## 2019-04-28 IMAGING — CT CT ABD-PELV W/O CM
2 of 4 series · 15 of 46 positions shown, 17 images · non-contrast
Comparison: April 24, 2018

CLINICAL DATA: Patient had a hemoperitoneum status post
embolization this past [REDACTED]. Now hemoglobin is trending down
again.

EXAM:
CT ABDOMEN AND PELVIS WITHOUT CONTRAST
TECHNIQUE: Multidetector CT imaging of the abdomen and pelvis was performed
following the standard protocol without IV contrast.

[Series 3: a/p w/o 5mm · axial · non-contrast · 0.72mm/px · z∈[+721,+1141]mm · 12 of 100 slices shown, 14 images]
[im 8/100  soft-tissue]
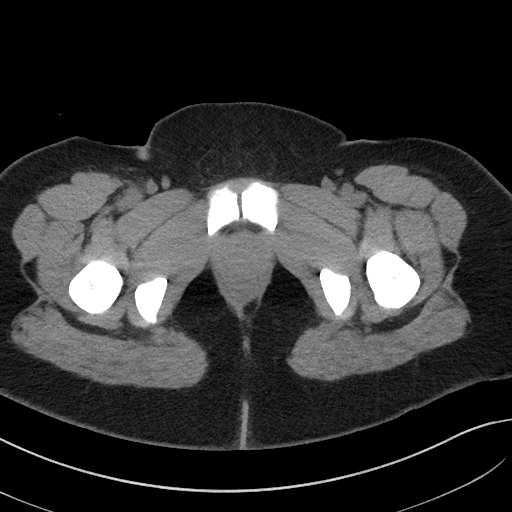
[im 8/100  bone]
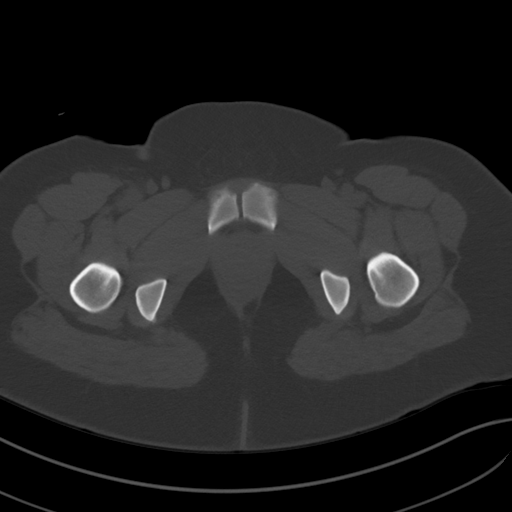
[im 16/100  soft-tissue]
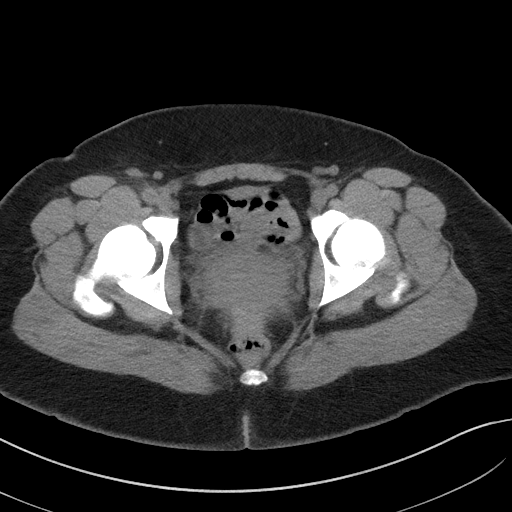
[im 23/100  soft-tissue]
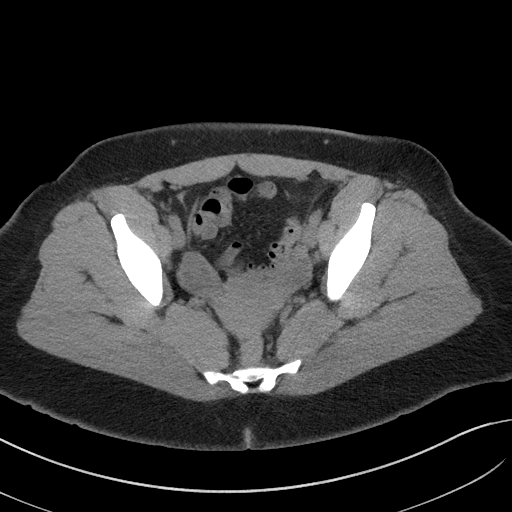
[im 31/100  soft-tissue]
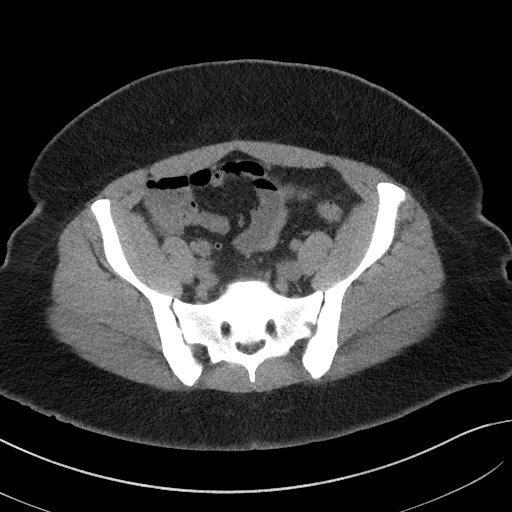
[im 39/100  soft-tissue]
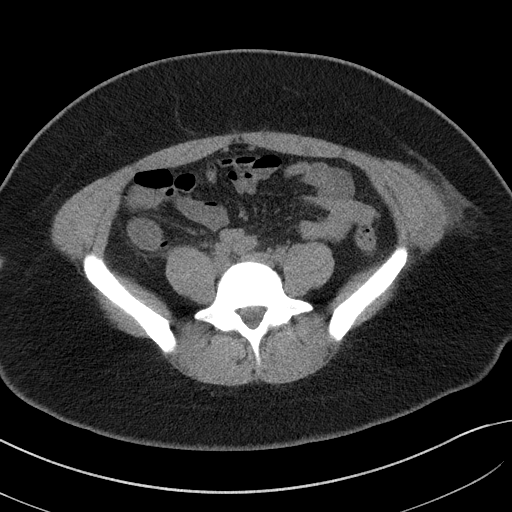
[im 46/100  soft-tissue]
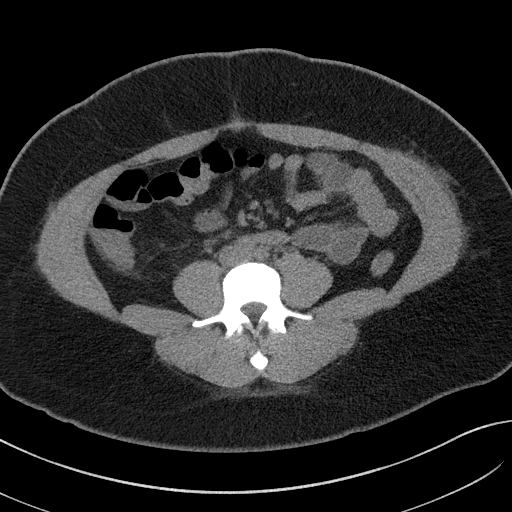
[im 54/100  soft-tissue]
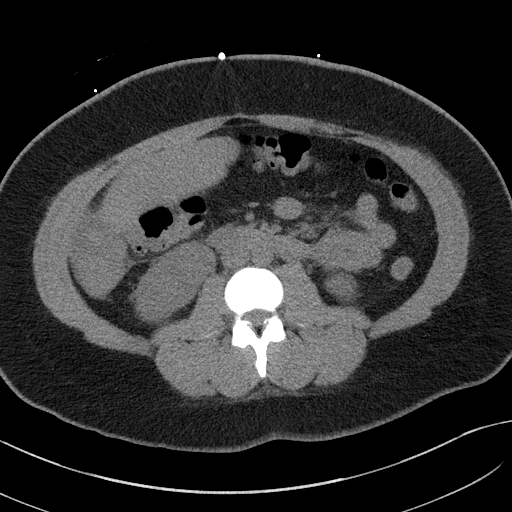
[im 61/100  soft-tissue]
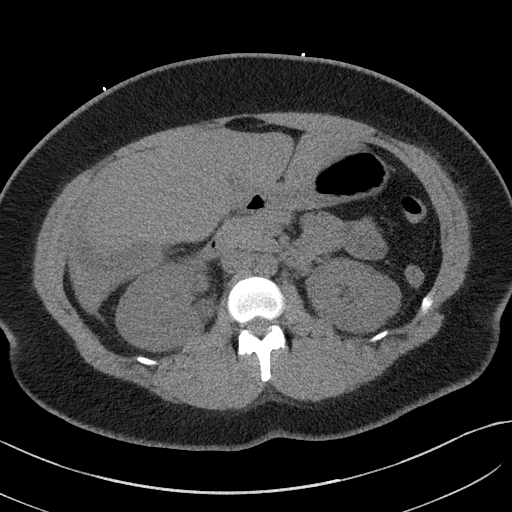
[im 69/100  soft-tissue]
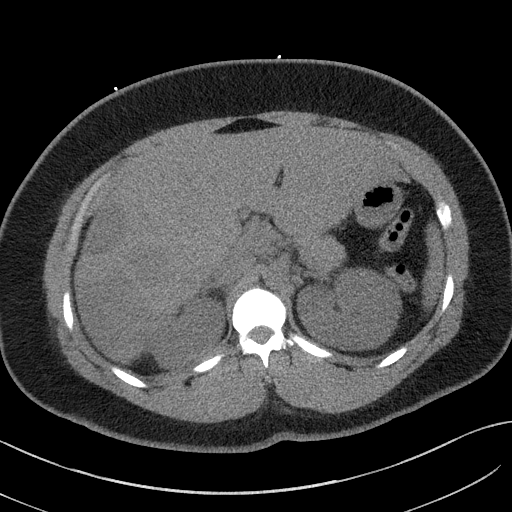
[im 69/100  bone]
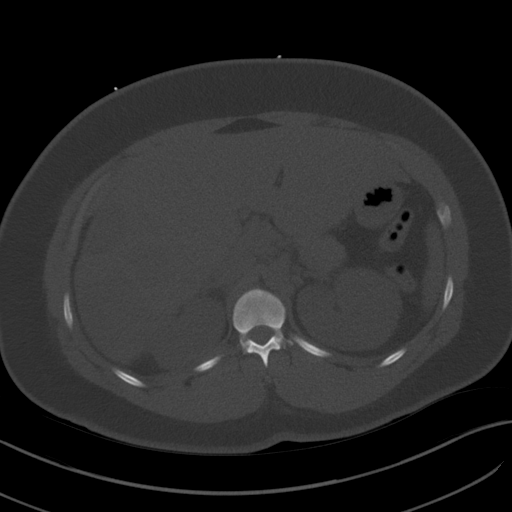
[im 77/100  soft-tissue]
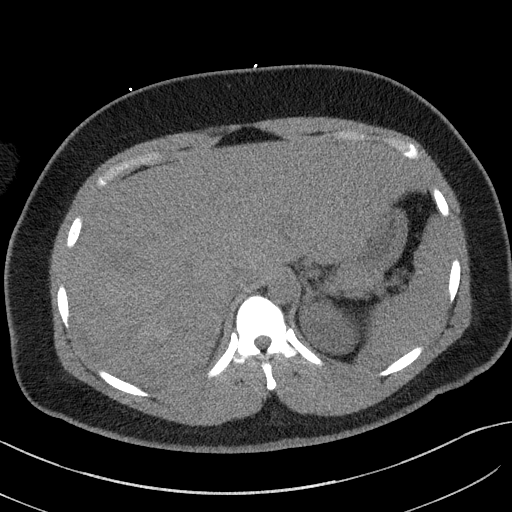
[im 84/100  soft-tissue]
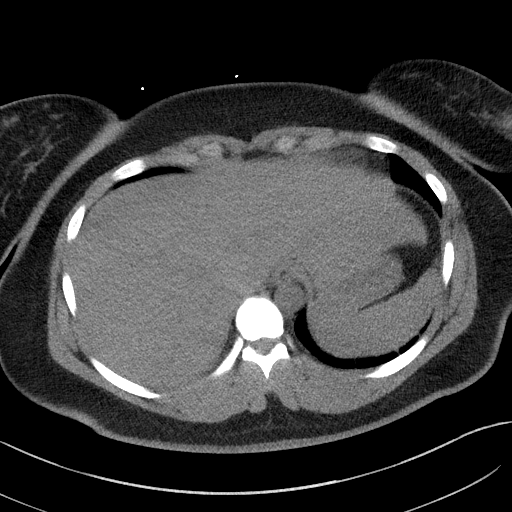
[im 92/100  soft-tissue]
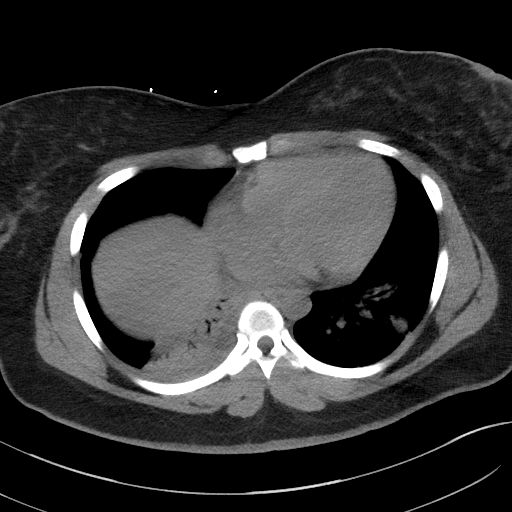

[Series 6: a/p w/o cor · coronal · non-contrast · 0.69mm/px · 3 of 151 slices shown]
[im 51/151  soft-tissue]
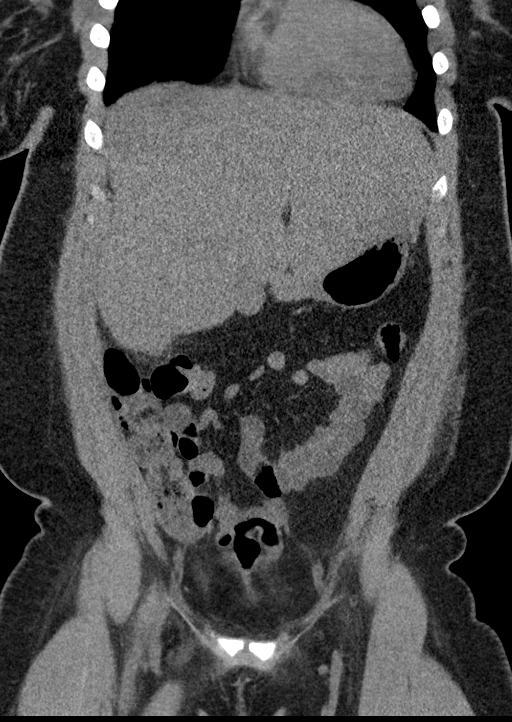
[im 67/151  soft-tissue]
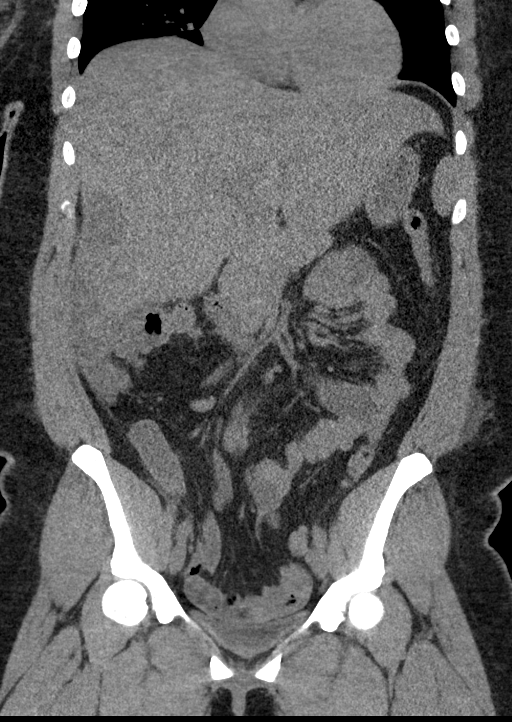
[im 84/151  soft-tissue]
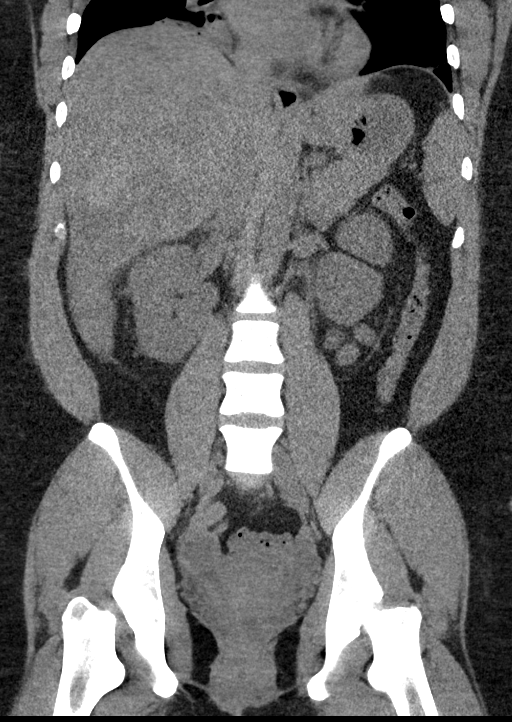

[15 of 46 positions shown; findings below may reference images not displayed]

FINDINGS: Lower chest: Small right pleural effusion. Consolidation of
bilateral lung bases are identified, right greater than left, favor
atelectasis. The heart size is normal.

Hepatobiliary: A 8.7 cm mixed hyper and hypodense lesion in the
right lobe liver with Hounsfield unit of 56 in the hyperdense
portion, not significant changed compared prior exam. Hemoperitoneum
is noted around the liver unchanged compared to prior exam. Pelvic
ascites is decreased compared to prior exam. The gallbladder is not
seen. No significant abnormalities identified in the biliary tree.

Pancreas: Unremarkable. No pancreatic ductal dilatation or
surrounding inflammatory changes.

Spleen: Normal in size without focal abnormality.

Adrenals/Urinary Tract: Adrenal glands are unremarkable. Kidneys are
normal, without renal calculi, focal lesion, or hydronephrosis. The
bladder is decompressed limiting evaluation.

Stomach/Bowel: Stomach is within normal limits. Appendix appears
normal. No evidence of bowel wall thickening, distention, or
inflammatory changes.

Vascular/Lymphatic: No significant vascular findings are present. No
enlarged abdominal or pelvic lymph nodes.

Reproductive: Uterus and bilateral adnexa are unremarkable.

Other: None.

Musculoskeletal: No acute or significant osseous findings.
IMPRESSION: 8.7 cm mixed hyper and hypodense lesion in the right lobe liver
without significant change since prior exam. Hemoperitoneum is noted
around the liver unchanged compared prior exam. Pelvic ascites is
decreased compared prior exam.

## 2019-11-10 ENCOUNTER — Other Ambulatory Visit: Payer: Self-pay

## 2019-11-10 DIAGNOSIS — Z20822 Contact with and (suspected) exposure to covid-19: Secondary | ICD-10-CM

## 2019-11-13 LAB — NOVEL CORONAVIRUS, NAA: SARS-CoV-2, NAA: NOT DETECTED

## 2020-03-30 ENCOUNTER — Other Ambulatory Visit: Payer: Self-pay

## 2020-03-30 ENCOUNTER — Ambulatory Visit: Payer: BC Managed Care – PPO | Attending: Internal Medicine

## 2020-03-30 DIAGNOSIS — Z23 Encounter for immunization: Secondary | ICD-10-CM

## 2020-03-30 NOTE — Progress Notes (Signed)
   Covid-19 Vaccination Clinic  Name:  Latoya Kramer    MRN: [id number] DOB: [birth year]  03/30/2020  [name] was observed post Covid-19 immunization for 15 minutes without incident. She was provided with Vaccine Information Sheet and instruction to access the V-Safe system.   [name] was instructed to call 911 with any severe reactions post vaccine: Marland Kitchen Difficulty breathing  . Swelling of face and throat  . A fast heartbeat  . A bad rash all over body  . Dizziness and weakness   Immunizations Administered    Name Date Dose VIS Date Route   Pfizer COVID-19 Vaccine 03/30/2020  3:41 PM 0.3 mL 12/01/2019 Intramuscular   Manufacturer: [employer], [employer]   Lot: [id number]   [place]: [id number]

## 2020-04-24 ENCOUNTER — Ambulatory Visit: Payer: BC Managed Care – PPO | Attending: Internal Medicine

## 2020-04-24 DIAGNOSIS — Z23 Encounter for immunization: Secondary | ICD-10-CM

## 2020-04-24 NOTE — Progress Notes (Signed)
   Covid-19 Vaccination Clinic  Name:  Chrisanna Hinkson    MRN: [id number] DOB: [birth year]  04/24/2020  Ms. Rain was observed post Covid-19 immunization for 15 minutes without incident. She was provided with Vaccine Information Sheet and instruction to access the V-Safe system.   Ms. Ohmann was instructed to call 911 with any severe reactions post vaccine: Marland Kitchen Difficulty breathing  . Swelling of face and throat  . A fast heartbeat  . A bad rash all over body  . Dizziness and weakness   Immunizations Administered    Name Date Dose VIS Date Route   Pfizer COVID-19 Vaccine 04/24/2020  4:37 PM 0.3 mL 02/14/2019 Intramuscular   Manufacturer: [place]   Lot: [id number]   [place]: [id number]

## 2021-01-02 ENCOUNTER — Encounter (HOSPITAL_BASED_OUTPATIENT_CLINIC_OR_DEPARTMENT_OTHER): Payer: Self-pay | Admitting: Obstetrics and Gynecology

## 2021-01-02 ENCOUNTER — Other Ambulatory Visit: Payer: Self-pay

## 2021-01-03 ENCOUNTER — Other Ambulatory Visit: Payer: Self-pay | Admitting: Obstetrics and Gynecology

## 2021-01-04 ENCOUNTER — Other Ambulatory Visit (HOSPITAL_COMMUNITY)
Admission: RE | Admit: 2021-01-04 | Discharge: 2021-01-04 | Disposition: A | Discharge status: Home / Self Care | Payer: Self-pay | Source: Ambulatory Visit | Attending: Obstetrics and Gynecology | Admitting: Obstetrics and Gynecology

## 2021-01-04 DIAGNOSIS — Z20822 Contact with and (suspected) exposure to covid-19: Secondary | ICD-10-CM | POA: Insufficient documentation

## 2021-01-04 DIAGNOSIS — Z01812 Encounter for preprocedural laboratory examination: Secondary | ICD-10-CM | POA: Insufficient documentation

## 2021-01-04 LAB — SARS CORONAVIRUS 2 (TAT 6-24 HRS): SARS Coronavirus 2: NEGATIVE

## 2021-01-06 ENCOUNTER — Encounter (HOSPITAL_BASED_OUTPATIENT_CLINIC_OR_DEPARTMENT_OTHER)
Admission: RE | Admit: 2021-01-06 | Discharge: 2021-01-06 | Disposition: A | Discharge status: Home / Self Care | Payer: Self-pay | Source: Ambulatory Visit | Attending: Obstetrics and Gynecology | Admitting: Obstetrics and Gynecology

## 2021-01-06 DIAGNOSIS — Z01818 Encounter for other preprocedural examination: Secondary | ICD-10-CM | POA: Insufficient documentation

## 2021-01-06 LAB — CBC
HCT: 36.4 % (ref 36.0–46.0)
Hemoglobin: 11.7 g/dL — ABNORMAL LOW (ref 12.0–15.0)
MCH: 27 pg (ref 26.0–34.0)
MCHC: 32.1 g/dL (ref 30.0–36.0)
MCV: 84.1 fL (ref 80.0–100.0)
Platelets: 335 10*3/uL (ref 150–400)
RBC: 4.33 MIL/uL (ref 3.87–5.11)
RDW: 16.5 % — ABNORMAL HIGH (ref 11.5–15.5)
WBC: 9.4 10*3/uL (ref 4.0–10.5)
nRBC: 0 % (ref 0.0–0.2)

## 2021-01-06 LAB — TYPE AND SCREEN
ABO/RH(D): O POS
Antibody Screen: NEGATIVE

## 2021-01-08 ENCOUNTER — Encounter (HOSPITAL_BASED_OUTPATIENT_CLINIC_OR_DEPARTMENT_OTHER)
Admission: RE | Disposition: A | Discharge status: Home / Self Care | Payer: Self-pay | Source: Home / Self Care | Attending: Obstetrics and Gynecology

## 2021-01-08 ENCOUNTER — Ambulatory Visit (HOSPITAL_BASED_OUTPATIENT_CLINIC_OR_DEPARTMENT_OTHER): Payer: Self-pay | Admitting: Certified Registered"

## 2021-01-08 ENCOUNTER — Encounter (HOSPITAL_BASED_OUTPATIENT_CLINIC_OR_DEPARTMENT_OTHER): Payer: Self-pay | Admitting: Obstetrics and Gynecology

## 2021-01-08 ENCOUNTER — Other Ambulatory Visit: Payer: Self-pay

## 2021-01-08 ENCOUNTER — Ambulatory Visit (HOSPITAL_BASED_OUTPATIENT_CLINIC_OR_DEPARTMENT_OTHER)
Admission: RE | Admit: 2021-01-08 | Discharge: 2021-01-08 | Disposition: A | Discharge status: Home / Self Care | Payer: Self-pay | Attending: Obstetrics and Gynecology | Admitting: Obstetrics and Gynecology

## 2021-01-08 DIAGNOSIS — Z79899 Other long term (current) drug therapy: Secondary | ICD-10-CM | POA: Insufficient documentation

## 2021-01-08 DIAGNOSIS — Z882 Allergy status to sulfonamides status: Secondary | ICD-10-CM | POA: Insufficient documentation

## 2021-01-08 DIAGNOSIS — O021 Missed abortion: Secondary | ICD-10-CM | POA: Insufficient documentation

## 2021-01-08 HISTORY — DX: Essential (primary) hypertension: I10

## 2021-01-08 HISTORY — PX: DILATION AND EVACUATION: SHX1459

## 2021-01-08 SURGERY — DILATION AND EVACUATION, UTERUS
Anesthesia: Monitor Anesthesia Care | Site: "Vagina "

## 2021-01-08 MED ORDER — IBUPROFEN 800 MG PO TABS: 800.0000 mg | ORAL_TABLET | Freq: Three times a day (TID) | ORAL | 0 refills | Status: AC | PRN

## 2021-01-08 MED ORDER — LIDOCAINE HCL (PF) 1 % IJ SOLN
INTRAMUSCULAR | Status: AC
  Filled 2021-01-08: qty 30

## 2021-01-08 MED ORDER — PROPOFOL 500 MG/50ML IV EMUL
INTRAVENOUS | Status: AC
  Filled 2021-01-08: qty 50

## 2021-01-08 MED ORDER — BUPIVACAINE HCL (PF) 0.25 % IJ SOLN
INTRAMUSCULAR | Status: DC | PRN
  Administered 2021-01-08: 20 mL

## 2021-01-08 MED ORDER — ONDANSETRON HCL 4 MG/2ML IJ SOLN
INTRAMUSCULAR | Status: DC | PRN
  Administered 2021-01-08: 4 mg via INTRAVENOUS

## 2021-01-08 MED ORDER — MIDAZOLAM HCL 2 MG/2ML IJ SOLN
INTRAMUSCULAR | Status: AC
  Filled 2021-01-08: qty 2

## 2021-01-08 MED ORDER — DEXMEDETOMIDINE (PRECEDEX) IN NS 20 MCG/5ML (4 MCG/ML) IV SYRINGE
PREFILLED_SYRINGE | INTRAVENOUS | Status: DC | PRN
  Administered 2021-01-08: 8 ug via INTRAVENOUS

## 2021-01-08 MED ORDER — POVIDONE-IODINE 10 % EX SWAB: 2.0000 "application " | Freq: Once | CUTANEOUS | Status: DC

## 2021-01-08 MED ORDER — SILVER NITRATE-POT NITRATE 75-25 % EX MISC
CUTANEOUS | Status: AC
  Filled 2021-01-08: qty 10

## 2021-01-08 MED ORDER — MIDAZOLAM HCL 2 MG/2ML IJ SOLN
INTRAMUSCULAR | Status: DC | PRN
  Administered 2021-01-08: 2 mg via INTRAVENOUS

## 2021-01-08 MED ORDER — SILVER NITRATE-POT NITRATE 75-25 % EX MISC
CUTANEOUS | Status: DC | PRN
  Administered 2021-01-08: 2 via TOPICAL

## 2021-01-08 MED ORDER — FENTANYL CITRATE (PF) 100 MCG/2ML IJ SOLN
INTRAMUSCULAR | Status: AC
  Filled 2021-01-08: qty 2

## 2021-01-08 MED ORDER — PROPOFOL 500 MG/50ML IV EMUL
INTRAVENOUS | Status: DC | PRN
  Administered 2021-01-08: 125 ug/kg/min via INTRAVENOUS

## 2021-01-08 MED ORDER — LACTATED RINGERS IV SOLN: INTRAVENOUS | Status: DC

## 2021-01-08 MED ORDER — HYDROCODONE-ACETAMINOPHEN 5-325 MG PO TABS: 1.0000 | ORAL_TABLET | Freq: Four times a day (QID) | ORAL | 0 refills | Status: AC | PRN

## 2021-01-08 MED ORDER — LIDOCAINE-EPINEPHRINE (PF) 1 %-1:200000 IJ SOLN
INTRAMUSCULAR | Status: AC
  Filled 2021-01-08: qty 30

## 2021-01-08 MED ORDER — BUPIVACAINE HCL (PF) 0.25 % IJ SOLN
INTRAMUSCULAR | Status: AC
  Filled 2021-01-08: qty 30

## 2021-01-08 MED ORDER — ACETAMINOPHEN 500 MG PO TABS
1000.0000 mg | ORAL_TABLET | ORAL | Status: AC
  Administered 2021-01-08: 1000 mg via ORAL

## 2021-01-08 MED ORDER — FENTANYL CITRATE (PF) 100 MCG/2ML IJ SOLN
INTRAMUSCULAR | Status: DC | PRN
  Administered 2021-01-08 (×2): 25 ug via INTRAVENOUS
  Administered 2021-01-08: 50 ug via INTRAVENOUS

## 2021-01-08 MED ORDER — HYDRALAZINE HCL 20 MG/ML IJ SOLN
INTRAMUSCULAR | Status: AC
  Filled 2021-01-08: qty 1

## 2021-01-08 MED ORDER — ACETAMINOPHEN 500 MG PO TABS
ORAL_TABLET | ORAL | Status: AC
  Filled 2021-01-08: qty 2

## 2021-01-08 MED ORDER — HYDRALAZINE HCL 20 MG/ML IJ SOLN
5.0000 mg | INTRAMUSCULAR | Status: DC | PRN
  Administered 2021-01-08 (×2): 5 mg via INTRAVENOUS

## 2021-01-08 MED ORDER — NIFEDIPINE ER 30 MG PO TB24: 30.0000 mg | ORAL_TABLET | Freq: Every day | ORAL | 6 refills | Status: AC

## 2021-01-08 SURGICAL SUPPLY — 21 items
CATH ROBINSON RED A/P 16FR (CATHETERS) ×1 IMPLANT
CNTNR URN SCR LID CUP LEK RST (MISCELLANEOUS) ×1 IMPLANT
CONT SPEC 4OZ STRL OR WHT (MISCELLANEOUS)
DILATOR CANAL MILEX (MISCELLANEOUS) IMPLANT
GAUZE 4X4 16PLY RFD (DISPOSABLE) ×3 IMPLANT
GLOVE BIOGEL M 6.5 STRL (GLOVE) ×6 IMPLANT
GLOVE ECLIPSE 6.5 STRL STRAW (GLOVE) ×2 IMPLANT
GLOVE SURG UNDER POLY LF SZ6.5 (GLOVE) ×3 IMPLANT
GLOVE SURG UNDER POLY LF SZ7 (GLOVE) ×5 IMPLANT
GOWN STRL REUS W/TWL LRG LVL3 (GOWN DISPOSABLE) ×7 IMPLANT
HIBICLENS CHG 4% 4OZ BTL (MISCELLANEOUS) ×3 IMPLANT
KIT BERKELEY 1ST TRI 3/8 NO TR (MISCELLANEOUS) ×2 IMPLANT
KIT BERKELEY 1ST TRIMESTER 3/8 (MISCELLANEOUS) ×2 IMPLANT
PACK VAGINAL MINOR WOMEN LF (CUSTOM PROCEDURE TRAY) ×3 IMPLANT
PAD OB MATERNITY 4.3X12.25 (PERSONAL CARE ITEMS) ×3 IMPLANT
PAD PREP 24X48 CUFFED NSTRL (MISCELLANEOUS) ×3 IMPLANT
SET BERKELEY SUCTION TUBING (SUCTIONS) ×2 IMPLANT
SLEEVE SCD COMPRESS KNEE MED (MISCELLANEOUS) ×3 IMPLANT
SYR CONTROL 10ML LL (SYRINGE) ×2 IMPLANT
TOWEL GREEN STERILE FF (TOWEL DISPOSABLE) ×4 IMPLANT
VACURETTE 9 RIGID CVD (CANNULA) ×2 IMPLANT

## 2021-01-08 NOTE — Anesthesia Preprocedure Evaluation (Signed)
Anesthesia Evaluation  Patient identified by MRN, date of birth, ID band Patient awake    Reviewed: Allergy & Precautions, NPO status , Patient's Chart, lab work & pertinent test results  History of Anesthesia Complications Negative for: history of anesthetic complications  Airway Mallampati: III  TM Distance: >3 FB Neck ROM: Full  Mouth opening: Limited Mouth Opening  Dental  (+) Teeth Intact   Pulmonary neg pulmonary ROS,    Pulmonary exam normal        Cardiovascular hypertension, Normal cardiovascular exam     Neuro/Psych negative neurological ROS  negative psych ROS   GI/Hepatic negative GI ROS, H/o ruptured hepatic adenoma with hemoperitoneum   Endo/Other  negative endocrine ROS  Renal/GU negative Renal ROS  negative genitourinary   Musculoskeletal negative musculoskeletal ROS (+)   Abdominal   Peds  Hematology  (+) anemia , REFUSES BLOOD PRODUCTS, JEHOVAH'S WITNESS  Anesthesia Other Findings   Reproductive/Obstetrics                           Anesthesia Physical Anesthesia Plan  ASA: II  Anesthesia Plan: MAC   Post-op Pain Management:    Induction: Intravenous  PONV Risk Score and Plan: 2 and Propofol infusion, TIVA and Treatment may vary due to age or medical condition  Airway Management Planned: Natural Airway, Nasal Cannula and Simple Face Mask  Additional Equipment: None  Intra-op Plan:   Post-operative Plan:   Informed Consent: I have reviewed the patients History and Physical, chart, labs and discussed the procedure including the risks, benefits and alternatives for the proposed anesthesia with the patient or authorized representative who has indicated his/her understanding and acceptance.       Plan Discussed with:   Anesthesia Plan Comments: (Discussed blood transfusion without any family or visitors present. Patient refuses blood transfusion under any  circumstances, and specifically accepts the risks of death, coma, stroke, permanent brain damage or other end-organ damage due to acute blood loss.)       Anesthesia Quick Evaluation

## 2021-01-08 NOTE — Op Note (Signed)
01/08/2021  1:32 PM  PATIENT:  Latoya Kramer  30 y.o. female  PRE-OPERATIVE DIAGNOSIS:  First Trimester Miscarriage  POST-OPERATIVE DIAGNOSIS:  First Trimester Miscarriage  PROCEDURE:  Procedure(s): DILATATION AND EVACUATION (N/A)  SURGEON:  Surgeon(s) and Role:    * Christophe Louis, MD - Primary  PHYSICIAN ASSISTANT:   ASSISTANTS: none   ANESTHESIA:   MAC  EBL:  150 mL   BLOOD ADMINISTERED:none  DRAINS: none   LOCAL MEDICATIONS USED:  MARCAINE     SPECIMEN:  Source of Specimen:  products of conception   DISPOSITION OF SPECIMEN:  PATHOLOGY  COUNTS:  YES  TOURNIQUET:  * No tourniquets in log *  DICTATION: .Dragon Dictation  PLAN OF CARE: Discharge to home after PACU  PATIENT DISPOSITION:  PACU - hemodynamically stable.   Delay start of Pharmacological VTE agent (>24hrs) due to surgical blood loss or risk of bleeding: not applicable  Findings normal external genitalia vaginal mucosa and cervix... products of conception obtained.   Procedure. Pt was taken to OR room #5 at [facility]. She was placed in the dorsal lithotomy position.  She was prepped and draped in the usual sterile fashion. Time out was performed. Speculum was placed. The anterior lip of the cervix was grasped with single tooth tenaculum. The uterus sounded to 8 cm. 25% marcaine was injected at the 4 and 8 oclock position of the cervix. The cervix was dilated to 10 mm. A 9 mm suction curette was used to obtained products of conception. A gentle curettage was performed with sharp curette until a gritty texture was noted. Suction curette was used to removed any remaining products. The single tooth tenaculum was removed from the anterior lip of the cervix. Silver nitrate was used to control bleeding from the tenaculum site. Excellent hemostasis was then noted. Pt tolerated the procedure well. Sponge lap and needle counts were correct x 2. Pt was awakened from anesthesia and taken to the  recovery room in stable condition.

## 2021-01-08 NOTE — Addendum Note (Signed)
Addendum  created 01/08/21 1433 by Pervis Hocking, DO   Alternative orders not taken and original order placed, Order Reconciliation Section accessed, Order list changed, Pharmacy for encounter modified

## 2021-01-08 NOTE — Discharge Instructions (Signed)
Tylenol given at 11:05 today, next dose not until 5:05 if needed   Post Anesthesia Home Care Instructions  Activity: Get plenty of rest for the remainder of the day. A responsible individual must stay with you for 24 hours following the procedure.  For the next 24 hours, DO NOT: -Drive a car -Paediatric nurse -Drink alcoholic beverages -Take any medication unless instructed by your physician -Make any legal decisions or sign important papers.  Meals: Start with liquid foods such as gelatin or soup. Progress to regular foods as tolerated. Avoid greasy, spicy, heavy foods. If nausea and/or vomiting occur, drink only clear liquids until the nausea and/or vomiting subsides. Call your physician if vomiting continues.  Special Instructions/Symptoms: Your throat may feel dry or sore from the anesthesia or the breathing tube placed in your throat during surgery. If this causes discomfort, gargle with warm salt water. The discomfort should disappear within 24 hours.  If you had a scopolamine patch placed behind your ear for the management of post- operative nausea and/or vomiting:  1. The medication in the patch is effective for 72 hours, after which it should be removed.  Wrap patch in a tissue and discard in the trash. Wash hands thoroughly with soap and water. 2. You may remove the patch earlier than 72 hours if you experience unpleasant side effects which may include dry mouth, dizziness or visual disturbances. 3. Avoid touching the patch. Wash your hands with soap and water after contact with the patch.

## 2021-01-08 NOTE — Anesthesia Postprocedure Evaluation (Signed)
Anesthesia Post Note  Patient: Latoya Kramer  Procedure(s) Performed: DILATATION AND EVACUATION (N/A Vagina )     Patient location during evaluation: PACU Anesthesia Type: MAC Level of consciousness: awake and alert Pain management: pain level controlled Vital Signs Assessment: post-procedure vital signs reviewed and stable Respiratory status: spontaneous breathing, nonlabored ventilation and respiratory function stable Cardiovascular status: blood pressure returned to baseline and stable Postop Assessment: no apparent nausea or vomiting Anesthetic complications: no   No complications documented.  Last Vitals:  Vitals:   01/08/21 1103 01/08/21 1337  BP: (!) 146/101 (!) 136/96  Pulse: 96   Resp: 16 (!) 25  Temp: 36.6 C 36.4 C  SpO2: 100% 100%    Last Pain:  Vitals:   01/08/21 1337  TempSrc:   PainSc: 0-No pain                 Lidia Collum

## 2021-01-08 NOTE — H&P (Signed)
Date of Initial H&P: 01/08/2021  History reviewed, patient examined, no change in status, stable for surgery.

## 2021-01-08 NOTE — H&P (Signed)
Latoya Kramer is an 30 y.o. female. G1P0 at [redacted] wks EGA diagnosed with missed AB. She had an ultrasound in the office on 01/06;/2022 that showed a 7 wk 5 day fetal pole with  No cardiac activity. She was diagnosed with missed AB she opted for cytotec for management however changed her mind and requsted a D&C for management.   Pertinent Gynecological History: Menses: flow is moderate Bleeding: NA Contraception: none DES exposure: denies Blood transfusions: none Sexually transmitted diseases: past history: chlamydia  Previous GYN Procedures: NA    Menstrual History: Menarche age: NA Patient's last menstrual period was 10/30/2020 (exact date).    Past Medical History:  Diagnosis Date  . Allergy   . Anemia   . Eczema   . Hemoperitoneum, nontraumatic   . Hypertension   . Liver mass     Past Surgical History:  Procedure Laterality Date  . EYE SURGERY    . IR ANGIOGRAM SELECTIVE EACH ADDITIONAL VESSEL  04/27/2018  . IR ANGIOGRAM SELECTIVE EACH ADDITIONAL VESSEL  04/27/2018  . IR ANGIOGRAM SELECTIVE EACH ADDITIONAL VESSEL  04/27/2018  . IR ANGIOGRAM SELECTIVE EACH ADDITIONAL VESSEL  04/27/2018  . IR ANGIOGRAM SELECTIVE EACH ADDITIONAL VESSEL  04/27/2018  . IR ANGIOGRAM SELECTIVE EACH ADDITIONAL VESSEL  04/27/2018  . IR ANGIOGRAM SELECTIVE EACH ADDITIONAL VESSEL  04/27/2018  . IR ANGIOGRAM VISCERAL SELECTIVE  04/27/2018  . IR EMBO ART  VEN HEMORR LYMPH EXTRAV  INC GUIDE ROADMAPPING  04/27/2018  . IR US GUIDE VASC ACCESS RIGHT  04/27/2018    History reviewed. No pertinent family history.  Social History:  reports that she has never smoked. She has never used smokeless tobacco. She reports that she does not drink alcohol and does not use drugs.  Allergies:  Allergies  Allergen Reactions  . Sulfa Antibiotics Other (See Comments)    unknown    Medications Prior to Admission  Medication Sig Dispense Refill Last Dose  . NIFEdipine (ADALAT CC) 30 MG 24 hr tablet Take 30 mg by mouth daily.    01/07/2021 at Unknown time    Review of Systems  Constitutional: Negative.   HENT: Negative.   Eyes: Negative.   Respiratory: Negative.   Cardiovascular: Negative.   Gastrointestinal: Negative.   Endocrine: Negative.   Genitourinary: Negative.   Musculoskeletal: Negative.   Skin: Negative.   Allergic/Immunologic: Negative.   Neurological: Negative.   Hematological: Negative.   Psychiatric/Behavioral: Negative.     Blood pressure (!) 146/101, pulse 96, temperature 97.9 F (36.6 C), temperature source Oral, resp. rate 16, height 5\' 3"  (1.6 m), weight 98.8 kg, last menstrual period 10/30/2020, SpO2 100 %. Physical Exam Vitals reviewed.  Constitutional:      Appearance: Normal appearance.  HENT:     Head: Normocephalic and atraumatic.     Mouth/Throat:     Mouth: Mucous membranes are moist.  Eyes:     Pupils: Pupils are equal, round, and reactive to light.  Cardiovascular:     Rate and Rhythm: Normal rate and regular rhythm.  Pulmonary:     Effort: Pulmonary effort is normal.     Breath sounds: Normal breath sounds.  Abdominal:     General: Abdomen is flat.     Tenderness: There is no abdominal tenderness.  Genitourinary:    General: Normal vulva.  Musculoskeletal:        General: Normal range of motion.     Cervical back: Normal range of motion and neck supple.  Skin:  General: Skin is warm and dry.  Neurological:     General: No focal deficit present.     Mental Status: She is alert and oriented to person, place, and time.  Psychiatric:        Mood and Affect: Mood normal.     No results found for this or any previous visit (from the past 24 hour(s)).  No results found.  Assessment/Plan: First trimester missed AB for D&C R/B/A of surgery discussed with the patient including but not limited to infection bleeding perforation of the uterus with the need for further surgery. R/o scarring of the uterus. Pt voiced understanding and desires to proceed.   Christophe Louis 01/08/2021, 11:33 AM

## 2021-01-08 NOTE — Transfer of Care (Signed)
Immediate Anesthesia Transfer of Care Note  Patient: Latoya Kramer  Procedure(s) Performed: DILATATION AND EVACUATION (N/A Vagina )  Patient Location: PACU  Anesthesia Type:MAC  Level of Consciousness: awake, alert  and oriented  Airway & Oxygen Therapy: Patient Spontanous Breathing and Patient connected to face mask oxygen  Post-op Assessment: Report given to RN and Post -op Vital signs reviewed and stable  Post vital signs: Reviewed and stable  Last Vitals:  Vitals Value Taken Time  BP 136/96 01/08/21 1337  Temp    Pulse    Resp 25 01/08/21 1337  SpO2      Last Pain:  Vitals:   01/08/21 1103  TempSrc: Oral  PainSc: 0-No pain         Complications: No complications documented.

## 2021-01-09 ENCOUNTER — Encounter (HOSPITAL_BASED_OUTPATIENT_CLINIC_OR_DEPARTMENT_OTHER): Payer: Self-pay | Admitting: Obstetrics and Gynecology

## 2021-01-09 LAB — SURGICAL PATHOLOGY

## 2024-12-06 ENCOUNTER — Encounter: Payer: Self-pay | Admitting: Dietician

## 2024-12-06 VITALS — Wt 221.3 lb

## 2024-12-06 DIAGNOSIS — R7303 Prediabetes: Secondary | ICD-10-CM

## 2024-12-06 NOTE — Patient Instructions (Signed)
 Goals Established by Pt  Goal 1: go on a walk or do a home workout or gym with friend 2-3 days a week for 30-60 minutes.   Goal 2: eat dinner at home at least 3 days a week (make it a Plate Method meal).

## 2024-12-06 NOTE — Progress Notes (Signed)
 Medical Nutrition Therapy  Appointment Start time:  65  Appointment End time:  1450  Primary concerns today: overall assessment, weight loss, prediabetes   Referral diagnosis: prediabetes Preferred learning style: no preference indicated Learning readiness: ready   NUTRITION ASSESSMENT   Anthropometrics   Wt 12/06/24: 221.3 lb  Clinical Medical Hx: reviewed; prediabetes, HTN Medications: reviewed Labs: 10/09/24:  Notable Signs/Symptoms: none reported Food Allergies: none  Lifestyle & Dietary Hx  Pt reports she works 2 jobs, full time as a armed forces logistics/support/administrative officer, 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 Pt reports work can be stressful.   Pt reports she has a dog which she takes on a short walk in the morning and evening. Pt states she has a friend who lives at an apartment with a gym and they used to go together but stopped and she is hoping to start back.   Pt reports she tends to eat out. Pt reports she typically doesn't eat breakfast, but if she does she may get a fast food biscuit. Pt reports lunch is with coworkers typically out, and dinner is typically out. Pt reports she cooked once this week (chicken tacos) and plans to eat leftovers today. Pt reports she just got a new cookware set so she hopes to cook more at home.   Estimated daily fluid intake: 32 oz Supplements: none Sleep: 6-8 hours Stress / self-care: moderate stress Current average weekly physical activity: ADLs  24-Hr Dietary Recall First Meal: none OR fast food biscuit Snack: none Second Meal: 11:30am: fast food philly cheese steak and fries OR chicken wrap OR longhorn salad Snack: none Third Meal: 7pm: out: philly and fries leftover OR cook: chicken tacos OR spaghetti Snack: none Beverages: sometimes coffee, water   NUTRITION DIAGNOSIS  NB-1.1 Food and nutrition-related knowledge deficit As related to lack of prior education by a registered dietitian.  As  evidenced by pt report.   NUTRITION INTERVENTION  Nutrition education (E-1) on the following topics:   Prediabetes Prediabetes: Prediabetes is a condition where blood sugar levels are higher than normal but not yet high enough to be diagnosed as type 2 diabetes. A1C, or hemoglobin A1c, is a blood test that provides an average of a person's blood sugar levels over the past two to three months. It is commonly used to diagnose and monitor diabetes. For prediabetes, an A1C level between 5.7% and 6.4% typically is used to diagnose this. Here is how the A1C levels are generally categorized: Normal:  A1C below 5.7% Prediabetes:  A1C between 5.7% and 6.4% Diabetes:  A1C of 6.5% or higher When diagnosed with prediabetes, there are several lifestyle changes you can make to manage the condition: Healthy Eating:  Follow a well-balanced diet that includes a variety of fruits, vegetables, whole grains, lean proteins, and healthy fats. Monitor portion sizes and reduce intake of sugary and processed foods. Regular Physical Activity:  Engage in regular physical activity, such as brisk walking, cycling, or other aerobic exercises, for at least 150 minutes per week. Include strength training exercises at least twice a week. Weight Management: Achieve and maintain a healthy weight. Losing even a small amount of weight (3-5%) can significantly improve insulin  sensitivity.   Handouts Provided Include  Plate Method  Learning Style & Readiness for Change Teaching method utilized: Visual & Auditory  Demonstrated degree of understanding via: Teach Back  Barriers to learning/adherence to lifestyle change: none  Goals Established by Pt  Goal  1: go on a walk or do a home workout or gym with friend 2-3 days a week for 30-60 minutes.   Goal 2: eat dinner at home at least 3 days a week (make it a Plate Method meal).   MONITORING & EVALUATION Dietary intake, weekly physical activity, and follow up in 6  weeks.  Next Steps  Patient is to call for questions.

## 2025-01-17 ENCOUNTER — Encounter: Admitting: Dietician
